# Patient Record
Sex: Female | Born: 1975 | Race: Black or African American | Hispanic: No | Marital: Single | Smoking: Former smoker
Health system: Southern US, Community
[De-identification: ages and names within clinical notes are randomized; demographics above are authoritative.]

## PROBLEM LIST (undated history)

## (undated) DIAGNOSIS — G709 Myoneural disorder, unspecified: Secondary | ICD-10-CM

## (undated) DIAGNOSIS — S82899A Other fracture of unspecified lower leg, initial encounter for closed fracture: Secondary | ICD-10-CM

## (undated) DIAGNOSIS — F329 Major depressive disorder, single episode, unspecified: Secondary | ICD-10-CM

## (undated) DIAGNOSIS — F1010 Alcohol abuse, uncomplicated: Secondary | ICD-10-CM

## (undated) DIAGNOSIS — R7303 Prediabetes: Secondary | ICD-10-CM

## (undated) DIAGNOSIS — D259 Leiomyoma of uterus, unspecified: Secondary | ICD-10-CM

## (undated) DIAGNOSIS — F411 Generalized anxiety disorder: Secondary | ICD-10-CM

## (undated) DIAGNOSIS — R200 Anesthesia of skin: Secondary | ICD-10-CM

## (undated) DIAGNOSIS — F32A Depression, unspecified: Secondary | ICD-10-CM

## (undated) DIAGNOSIS — F419 Anxiety disorder, unspecified: Secondary | ICD-10-CM

## (undated) DIAGNOSIS — E559 Vitamin D deficiency, unspecified: Secondary | ICD-10-CM

## (undated) DIAGNOSIS — E785 Hyperlipidemia, unspecified: Secondary | ICD-10-CM

## (undated) DIAGNOSIS — F1011 Alcohol abuse, in remission: Secondary | ICD-10-CM

## (undated) DIAGNOSIS — E538 Deficiency of other specified B group vitamins: Secondary | ICD-10-CM

## (undated) HISTORY — PX: FRACTURE SURGERY: SHX138

## (undated) HISTORY — PX: BREAST SURGERY: SHX581

## (undated) HISTORY — DX: Alcohol abuse, uncomplicated: F10.10

## (undated) HISTORY — PX: CHOLECYSTECTOMY: SHX55

## (undated) HISTORY — DX: Anxiety disorder, unspecified: F41.9

## (undated) HISTORY — DX: Depression, unspecified: F32.A

---
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
## 1998-06-17 ENCOUNTER — Emergency Department (HOSPITAL_COMMUNITY): Admission: EM | Admit: 1998-06-17 | Discharge: 1998-06-17 | Payer: Self-pay

## 2002-10-19 ENCOUNTER — Ambulatory Visit (HOSPITAL_BASED_OUTPATIENT_CLINIC_OR_DEPARTMENT_OTHER): Admission: RE | Admit: 2002-10-19 | Discharge: 2002-10-19 | Payer: Self-pay | Admitting: Obstetrics and Gynecology

## 2003-09-10 ENCOUNTER — Other Ambulatory Visit: Admission: RE | Admit: 2003-09-10 | Discharge: 2003-09-10 | Payer: Self-pay | Admitting: Obstetrics and Gynecology

## 2003-10-19 ENCOUNTER — Ambulatory Visit (HOSPITAL_COMMUNITY): Admission: RE | Admit: 2003-10-19 | Discharge: 2003-10-19 | Payer: Self-pay | Admitting: Gastroenterology

## 2003-10-21 ENCOUNTER — Ambulatory Visit (HOSPITAL_COMMUNITY): Admission: RE | Admit: 2003-10-21 | Discharge: 2003-10-21 | Payer: Self-pay | Admitting: Gastroenterology

## 2004-07-11 ENCOUNTER — Ambulatory Visit (HOSPITAL_COMMUNITY): Admission: RE | Admit: 2004-07-11 | Discharge: 2004-07-11 | Payer: Self-pay

## 2004-07-30 HISTORY — PX: LAPAROSCOPIC CHOLECYSTECTOMY: SUR755

## 2012-05-06 ENCOUNTER — Ambulatory Visit (INDEPENDENT_AMBULATORY_CARE_PROVIDER_SITE_OTHER): Payer: Medicare HMO | Admitting: Licensed Clinical Social Worker

## 2012-05-06 DIAGNOSIS — F331 Major depressive disorder, recurrent, moderate: Secondary | ICD-10-CM

## 2012-05-19 ENCOUNTER — Ambulatory Visit: Payer: Medicare HMO | Admitting: Licensed Clinical Social Worker

## 2013-03-17 ENCOUNTER — Ambulatory Visit: Payer: Medicare HMO | Admitting: Dietician

## 2013-12-13 ENCOUNTER — Emergency Department (HOSPITAL_COMMUNITY): Payer: Managed Care, Other (non HMO)

## 2013-12-13 ENCOUNTER — Encounter (HOSPITAL_COMMUNITY): Payer: Self-pay | Admitting: Emergency Medicine

## 2013-12-13 ENCOUNTER — Emergency Department (HOSPITAL_COMMUNITY)
Admission: EM | Admit: 2013-12-13 | Discharge: 2013-12-13 | Disposition: A | Discharge status: Home / Self Care | Payer: Managed Care, Other (non HMO) | Attending: Emergency Medicine | Admitting: Emergency Medicine

## 2013-12-13 DIAGNOSIS — S9306XA Dislocation of unspecified ankle joint, initial encounter: Secondary | ICD-10-CM | POA: Insufficient documentation

## 2013-12-13 DIAGNOSIS — E663 Overweight: Secondary | ICD-10-CM | POA: Insufficient documentation

## 2013-12-13 DIAGNOSIS — X58XXXA Exposure to other specified factors, initial encounter: Secondary | ICD-10-CM | POA: Insufficient documentation

## 2013-12-13 DIAGNOSIS — Z87891 Personal history of nicotine dependence: Secondary | ICD-10-CM | POA: Insufficient documentation

## 2013-12-13 DIAGNOSIS — Y92009 Unspecified place in unspecified non-institutional (private) residence as the place of occurrence of the external cause: Secondary | ICD-10-CM | POA: Insufficient documentation

## 2013-12-13 DIAGNOSIS — Y9301 Activity, walking, marching and hiking: Secondary | ICD-10-CM | POA: Insufficient documentation

## 2013-12-13 DIAGNOSIS — S82891A Other fracture of right lower leg, initial encounter for closed fracture: Secondary | ICD-10-CM

## 2013-12-13 LAB — CBC WITH DIFFERENTIAL/PLATELET
Basophils Absolute: 0 10*3/uL (ref 0.0–0.1)
Basophils Relative: 0 % (ref 0–1)
Eosinophils Absolute: 0.1 10*3/uL (ref 0.0–0.7)
Eosinophils Relative: 1 % (ref 0–5)
HCT: 37.5 % (ref 36.0–46.0)
Hemoglobin: 12 g/dL (ref 12.0–15.0)
Lymphocytes Relative: 34 % (ref 12–46)
Lymphs Abs: 3 10*3/uL (ref 0.7–4.0)
MCH: 23.5 pg — ABNORMAL LOW (ref 26.0–34.0)
MCHC: 32 g/dL (ref 30.0–36.0)
MCV: 73.4 fL — ABNORMAL LOW (ref 78.0–100.0)
Monocytes Absolute: 0.4 10*3/uL (ref 0.1–1.0)
Monocytes Relative: 5 % (ref 3–12)
Neutro Abs: 5.3 10*3/uL (ref 1.7–7.7)
Neutrophils Relative %: 60 % (ref 43–77)
Platelets: 336 10*3/uL (ref 150–400)
RBC: 5.11 MIL/uL (ref 3.87–5.11)
RDW: 14.2 % (ref 11.5–15.5)
WBC: 8.8 10*3/uL (ref 4.0–10.5)

## 2013-12-13 LAB — BASIC METABOLIC PANEL
BUN: 13 mg/dL (ref 6–23)
CO2: 22 mEq/L (ref 19–32)
Calcium: 9.2 mg/dL (ref 8.4–10.5)
Chloride: 102 mEq/L (ref 96–112)
Creatinine, Ser: 0.56 mg/dL (ref 0.50–1.10)
GFR calc Af Amer: >90 mL/min (ref >90)
GFR calc non Af Amer: >90 mL/min (ref >90)
Glucose, Bld: 114 mg/dL — ABNORMAL HIGH (ref 70–99)
Potassium: 4.5 mEq/L (ref 3.7–5.3)
Sodium: 140 mEq/L (ref 137–147)

## 2013-12-13 MED ORDER — PROPOFOL 10 MG/ML IV BOLUS
100.0000 mg | Freq: Once | INTRAVENOUS | Status: DC
  Filled 2013-12-13 (×2): qty 1

## 2013-12-13 MED ORDER — OXYCODONE-ACETAMINOPHEN 5-325 MG PO TABS: 1.0000 | ORAL_TABLET | Freq: Four times a day (QID) | ORAL | Status: DC | PRN

## 2013-12-13 MED ORDER — MORPHINE SULFATE 4 MG/ML IJ SOLN
4.0000 mg | Freq: Once | INTRAMUSCULAR | Status: AC
Start: 2013-12-13 — End: 2013-12-13
  Administered 2013-12-13: 4 mg via INTRAVENOUS
  Filled 2013-12-13: qty 1

## 2013-12-13 MED ORDER — PROPOFOL 10 MG/ML IV BOLUS
INTRAVENOUS | Status: AC | PRN
  Administered 2013-12-13: 20 mg via INTRAVENOUS
  Administered 2013-12-13: 50 mg via INTRAVENOUS

## 2013-12-13 MED ORDER — OXYCODONE-ACETAMINOPHEN 5-325 MG PO TABS
1.0000 | ORAL_TABLET | Freq: Four times a day (QID) | ORAL | Status: DC | PRN
  Administered 2013-12-13: 1 via ORAL
  Filled 2013-12-13: qty 1

## 2013-12-13 MED ORDER — ONDANSETRON HCL 4 MG/2ML IJ SOLN
4.0000 mg | Freq: Once | INTRAMUSCULAR | Status: AC
  Administered 2013-12-13: 4 mg via INTRAVENOUS
  Filled 2013-12-13: qty 2

## 2013-12-13 MED ORDER — SODIUM CHLORIDE 0.9 % IV SOLN
INTRAVENOUS | Status: AC | PRN
  Administered 2013-12-13: 1000 mL via INTRAVENOUS

## 2013-12-13 NOTE — ED Notes (Signed)
Ortho tech at bedside doing crutches education.

## 2013-12-13 NOTE — ED Notes (Signed)
Bed: IT19 Expected date:  Expected time:  Means of arrival:  Comments: EMS 89F Fall, ankle injury

## 2013-12-13 NOTE — Discharge Instructions (Signed)
Ankle Fracture A fracture is a break in the bone. A cast or splint is used to protect and keep your injured bone from moving.  HOME CARE INSTRUCTIONS   Use your crutches as directed.  To lessen the swelling, keep the injured leg elevated while sitting or lying down.  Apply ice to the injury for 15-20 minutes, 03-04 times per day while awake for 2 days. Put the ice in a plastic bag and place a thin towel between the bag of ice and your cast.  If you have a plaster or fiberglass cast:  Do not try to scratch the skin under the cast using sharp or pointed objects.  Check the skin around the cast every day. You may put lotion on any red or sore areas.  Keep your cast dry and clean.  If you have a plaster splint:  Wear the splint as directed.  You may loosen the elastic around the splint if your toes become numb, tingle, or turn cold or blue.  Do not put pressure on any part of your cast or splint; it may break. Rest your cast only on a pillow the first 24 hours until it is fully hardened.  Your cast or splint can be protected during bathing with a plastic bag. Do not lower the cast or splint into water.  Take medications as directed by your caregiver. Only take over-the-counter or prescription medicines for pain, discomfort, or fever as directed by your caregiver.  Do not drive a vehicle until your caregiver specifically tells you it is safe to do so.  If your caregiver has given you a follow-up appointment, it is very important to keep that appointment. Not keeping the appointment could result in a chronic or permanent injury, pain, and disability. If there is any problem keeping the appointment, you must call back to this facility for assistance. SEEK IMMEDIATE MEDICAL CARE IF:   Your cast gets damaged or breaks.  You have continued severe pain or more swelling than you did before the cast was put on.  Your skin or toenails below the injury turn blue or gray, or feel cold or  numb.  There is a bad smell or new stains and/or purulent (pus like) drainage coming from under the cast. If you do not have a window in your cast for observing the wound, a discharge or minor bleeding may show up as a stain on the outside of your cast. Report these findings to your caregiver. MAKE SURE YOU:   Understand these instructions.  Will watch your condition.  Will get help right away if you are not doing well or get worse. Document Released: 07/13/2000 Document Revised: 10/08/2011 Document Reviewed: 02/12/2013 [facility] Patient Information 2014 [place], [place].  Ankle Dislocation Ankle dislocation is the displacement of the bones that form your ankle joint. The ankle joint is designed for a balance of stability and flexibility. The bones of the ankle are held in place by very strong, fibrous tissues (ligaments) that connect the bones to each other. CAUSES Because the ankle is a very strong and stable joint, ankle dislocation is only caused by a very forceful injury. Typically, injuries that contribute to ankle dislocation include broken bones (fractures) on the inside and outside of the ankle (malleoli).  RELATED COMPLICATIONS Ankle dislocation can lead to more serious complications. Examples of complications associated with ankle dislocation include:  Injury to the strong fibrous tissues that connect muscles to bones (tendons).  Injury to the flexible tissue that cushions the  bones in the joint (cartilage). This can lead to the development of arthritis, loss of joint motion, and pain.  Injury to the nerves and blood vessels that cross the ankle. Blood vessel damage may result in bone death of the top bone of the foot (talus).  Skin over the dislocated area being torn (lacerated) or damaged by pressure from the dislocated bones.  Swelling of compartments in the foot (rare). This may damage blood supply to the muscles (compartment syndrome). RISK FACTORS Although dislocation of  the ankle can occur in anyone, some people are at greater risk than others. People at increased risk of ankle dislocation include:  Young males. This may be related to their overall increased risk of injury.  Postmenopausal women. This may be related to their increased risk of bone fracture because of the weakening of the bones that occurs in women in this age group (osteoporosis).  People born with greater looseness (elasticity) in their ligaments. SYMPTOMS Symptoms of ankle dislocation include:  Severe pain.  Swelling.  Deformity around the ankle.  Whitening or laceration of the skin. DIAGNOSIS  A physical exam and an X-ray exam are usually done to help your caregiver diagnose ankle dislocation. TREATMENT Treatment may include:  Manipulation of the ankle by your caregiver to put your ankle back in place (reduction).  Repair of any associated skin lacerations.  Plates and screws used to stabilize the fractures and hold the joint in position after reduction.  Pins drilled into your bones that are connected to bars outside of your skin (external fixator) used to hold your ankle in a fixed position until the swelling in your ankle goes down enough for surgery to be done.  Placement of a cast or splint to allow torn ligaments to heal.  Physical therapy to regain ankle motion and leg strength. HOME CARE INSTRUCTIONS The following measures can help to reduce pain and hasten the healing process:  Rest your injured joint. Do not move it. Avoid activities similar to the one that caused your injury.  Apply ice to your injured joint for 1 to 2 days after your reduction or as directed by your caregiver. Applying ice helps to reduce inflammation and pain.  Put ice in a plastic bag.  Place a towel between your skin and the bag.  Leave the ice on for 15 to 20 minutes at a time, every couple of hours while you are awake.  Elevate your ankle above your heart to minimize  swelling.  Move your toes as instructed by your caregiver to prevent stiffness.  Take over-the-counter or prescription medicines for pain as directed by your caregiver. SEEK IMMEDIATE MEDICAL CARE IF:  Your cast, splint, screws, plates, or external fixator becomes loose or damaged.  You have an external fixator and you notice fluids draining around the pins.  Your pain becomes worse rather than better.  You lose feeling in your toe or cannot bend the tip of your toe. MAKE SURE YOU:  Understand these instructions.  Will watch your condition.  Will get help right away if you are not doing well or get worse. Document Released: 07/16/2005 Document Revised: 10/08/2011 Document Reviewed: 12/14/2010 [facility] Patient Information 2014 [place], [place].

## 2013-12-13 NOTE — ED Provider Notes (Signed)
CSN: [id number]     Arrival date & time 12/13/13  0440 History   First MD Initiated Contact with Patient 12/13/13 0448     Chief Complaint  Patient presents with  . Ankle Injury     (Consider location/radiation/quality/duration/timing/severity/associated sxs/prior Treatment) HPI  This is a 38 year old female with no significant past medical history who presents with a right ankle injury. Patient reports that she misstepped and injured her right ankle.  Patient was given 200 mcg of fentanyl were given in route. Patient reports her pain is 10. She denies any numbness or tingling of the right foot. She denies hitting her head or loss of consciousness. She's not on any anticoagulants.  History reviewed. No pertinent past medical history. Past Surgical History  Procedure Laterality Date  . Cholecystectomy     No family history on file. History  Substance Use Topics  . Smoking status: Former Research scientist (life sciences)  . Smokeless tobacco: Not on file  . Alcohol Use: Yes   OB History   Grav Para Term Preterm Abortions TAB SAB Ect Mult Living                 Review of Systems  Musculoskeletal:       Right ankle pain  Neurological: Negative for weakness, numbness and headaches.  All other systems reviewed and are negative.     Allergies  Review of patient's allergies indicates no known allergies.  Home Medications   Prior to Admission medications   Medication Sig Start Date End Date Taking? Authorizing Provider  ibuprofen (ADVIL,MOTRIN) 200 MG tablet Take 400 mg by mouth every 6 (six) hours as needed (for pain/fever).   Yes Historical Provider, MD   BP 107/49  Pulse 100  Temp(Src) 98.2 F (36.8 C) (Oral)  Resp 15  Ht 5\' 1"  (1.549 m)  Wt 240 lb (108.863 kg)  BMI 45.37 kg/m2  SpO2 99%  LMP 11/19/2013 Physical Exam  Nursing note and vitals reviewed. Constitutional: She is oriented to person, place, and time. She appears well-developed and well-nourished.  Overweight  HENT:  Head:  Normocephalic and atraumatic.  Braces  Eyes: Pupils are equal, round, and reactive to light.  Cardiovascular: Normal rate, regular rhythm and normal heart sounds.   No murmur heard. Pulmonary/Chest: Effort normal and breath sounds normal. No respiratory distress. She has no wheezes.  Abdominal: Soft. There is no tenderness.  Musculoskeletal:  Obvious deformity of the right ankle with mild tenting of the skin anteriorly, 2+ DP pulse, skin intact, neurovascularly intact distally  Neurological: She is alert and oriented to person, place, and time.  Skin: Skin is warm and dry.  Psychiatric: She has a normal mood and affect.    ED Course  Reduction of dislocation Date/Time: 12/13/2013 8:31 AM Performed by: Thayer Jew, F Authorized by: Thayer Jew, F Consent: Verbal consent obtained. written consent obtained. Risks and benefits: risks, benefits and alternatives were discussed Consent given by: patient Patient sedated: yes Sedation type: moderate (conscious) sedation Sedatives: propofol Patient tolerance: Patient tolerated the procedure well with no immediate complications. Comments: Reduction of right ankle fracture   (including critical care time)  Procedural sedation Performed by: Merryl Hacker Consent: Verbal consent obtained. Risks and benefits: risks, benefits and alternatives were discussed Required items: required blood products, implants, devices, and special equipment available Patient identity confirmed: arm band and provided demographic data Time out: Immediately prior to procedure a "time out" was called to verify the correct patient, procedure, equipment, support staff and site/side marked  as required.  Sedation type: moderate (conscious) sedation NPO time confirmed and considedered  Sedatives: PROPOFOL  Physician Time at Bedside: 20 min  Vitals: Vital signs were monitored during sedation. Cardiac Monitor, pulse oximeter Patient tolerance: Patient  tolerated the procedure well with no immediate complications. Comments: Pt with uneventful recovered. Returned to pre-procedural sedation baseline     Labs Review Labs Reviewed  CBC WITH DIFFERENTIAL - Abnormal; Notable for the following:    MCV 73.4 (*)    MCH 23.5 (*)    All other components within normal limits  BASIC METABOLIC PANEL - Abnormal; Notable for the following:    Glucose, Bld 114 (*)    All other components within normal limits    Imaging Review Dg Ankle Complete Right  12/13/2013   CLINICAL DATA:  Post reduction, right ankle fracture-dislocation  EXAM: RIGHT ANKLE - COMPLETE 3+ VIEW  COMPARISON:  12/13/2013  FINDINGS: Cast artifact obscures detail. There is widening of the mortise medially with apparent relocation of the tibia with respect to the talar dome. Oblique distal fibular diaphyseal fracture reidentified with fracture fragments in near anatomic alignment. No radiopaque foreign body.  IMPRESSION: Postreduction right ankle fracture-dislocation as above.   Electronically Signed   By: Conchita Paris M.D.   On: 12/13/2013 07:51   Dg Ankle Complete Right  12/13/2013   CLINICAL DATA:  Right ankle pain and deformity following a fall.  EXAM: RIGHT ANKLE - COMPLETE 3+ VIEW  COMPARISON:  None.  FINDINGS: Anterior and medial dislocation of the distal tibia relative to the talus. Two small bone fragments between the two. There is also a fracture of the distal shaft of the fibula with 2/3 shaft width of lateral displacement and mild lateral angulation of the distal fragment. There is also pronounced posterior angulation of the distal fragment. Also noted is a small avulsion fracture off the distal aspect of the lateral malleolus.  IMPRESSION: Dislocation and fractures, as described above.   Electronically Signed   By: Enrique Sack M.D.   On: 12/13/2013 05:38     EKG Interpretation None      MDM   Final diagnoses:  Ankle dislocation  R closed ankle fracture  Patient  presents with injury to the right ankle. No other obvious injury on exam. She is neurovascularly intact. X-ray shows fracture dislocation. Patient was consented for sedation and dislocation was reduced at the bedside without complication. Discuss with Dr. Tonita Cong  he has reviewed post reduction films and will followup with the patient tomorrow. Patient will be discharged with pain medication and crutches. Patient was given strict return precautions.  After history, exam, and medical workup I feel the patient has been appropriately medically screened and is safe for discharge home. Pertinent diagnoses were discussed with the patient. Patient was given return precautions.      Merryl Hacker, MD 12/13/13 2248

## 2013-12-13 NOTE — ED Notes (Signed)
Per EMS pt was at a friend's house when she missed a step injuring her right ankle.  Per EMS there is obvious deformity. Pt was given 200 mcg of fentanyl en route.  Pt became drowsy O2 @ 2L placed on pt to maintain O2 sats.

## 2013-12-13 NOTE — ED Notes (Signed)
Patient transported to X-ray 

## 2013-12-14 ENCOUNTER — Other Ambulatory Visit: Payer: Self-pay | Admitting: Orthopedic Surgery

## 2013-12-14 ENCOUNTER — Emergency Department (HOSPITAL_COMMUNITY)
Admission: EM | Admit: 2013-12-14 | Discharge: 2013-12-14 | Disposition: A | Discharge status: Home / Self Care | Payer: Managed Care, Other (non HMO) | Attending: Emergency Medicine | Admitting: Emergency Medicine

## 2013-12-14 ENCOUNTER — Encounter (HOSPITAL_COMMUNITY): Payer: Self-pay | Admitting: Emergency Medicine

## 2013-12-14 ENCOUNTER — Encounter (HOSPITAL_COMMUNITY): Payer: Self-pay | Admitting: Pharmacy Technician

## 2013-12-14 DIAGNOSIS — Z87891 Personal history of nicotine dependence: Secondary | ICD-10-CM | POA: Insufficient documentation

## 2013-12-14 DIAGNOSIS — G8911 Acute pain due to trauma: Secondary | ICD-10-CM | POA: Insufficient documentation

## 2013-12-14 DIAGNOSIS — M25579 Pain in unspecified ankle and joints of unspecified foot: Secondary | ICD-10-CM | POA: Insufficient documentation

## 2013-12-14 DIAGNOSIS — R52 Pain, unspecified: Secondary | ICD-10-CM

## 2013-12-14 MED ORDER — HYDROMORPHONE HCL PF 1 MG/ML IJ SOLN
1.0000 mg | Freq: Once | INTRAMUSCULAR | Status: AC
  Administered 2013-12-14: 1 mg via INTRAMUSCULAR
  Filled 2013-12-14: qty 1

## 2013-12-14 MED ORDER — ONDANSETRON HCL 4 MG PO TABS
4.0000 mg | ORAL_TABLET | Freq: Once | ORAL | Status: AC
  Administered 2013-12-14: 4 mg via ORAL
  Filled 2013-12-14: qty 1

## 2013-12-14 MED ORDER — HYDROMORPHONE HCL PF 1 MG/ML IJ SOLN: 1.0000 mg | Freq: Once | INTRAMUSCULAR | Status: DC

## 2013-12-14 MED ORDER — OXYCODONE-ACETAMINOPHEN 5-325 MG PO TABS
2.0000 | ORAL_TABLET | Freq: Once | ORAL | Status: AC
  Administered 2013-12-14: 2 via ORAL
  Filled 2013-12-14: qty 2

## 2013-12-14 MED ORDER — ONDANSETRON HCL 4 MG/2ML IJ SOLN: 4.0000 mg | Freq: Once | INTRAMUSCULAR | Status: DC

## 2013-12-14 NOTE — ED Notes (Addendum)
Pt. reports persistent right ankle pain / toes numb unrelieved by pain medications prescribed here this evening , diagnosed with right ankle dislocation/fracture.

## 2013-12-14 NOTE — Discharge Instructions (Signed)
Take Percocet 2 tabs every 4 hours as needed for pain.  Keep follow up as directed with orthopedic surgeon today in the office.  Keep your foot elevated above the level of your heart. Apply ice. Use crutches do not bear weight on your right leg.

## 2013-12-14 NOTE — ED Notes (Signed)
Applied 2 ice bags to either side of right ankle and raised the leg with two pillows, both for swelling and pain control.

## 2013-12-14 NOTE — ED Provider Notes (Signed)
CSN: [id number]     Arrival date & time 12/14/13  0435 History   First MD Initiated Contact with Patient 12/14/13 0532     Chief Complaint  Patient presents with  . Ankle Pain     (Consider location/radiation/quality/duration/timing/severity/associated sxs/prior Treatment) HPI History provided by patient. Was evaluated here last night for right ankle fracture dislocation. She had sedation and reduction. She was discharged home with prescription for Percocet which she has been taking as prescribed one pill every 6 hours. She continues to have severe pain and presents with uncontrolled symptoms at home. No new trauma. Pain sharp in quality and not radiating from right ankle. No known alleviating factors. She is planning to see orthopedic surgeon today in clinic.  History reviewed. No pertinent past medical history. Past Surgical History  Procedure Laterality Date  . Cholecystectomy     No family history on file. History  Substance Use Topics  . Smoking status: Former Research scientist (life sciences)  . Smokeless tobacco: Not on file  . Alcohol Use: Yes   OB History   Grav Para Term Preterm Abortions TAB SAB Ect Mult Living                 Review of Systems  Respiratory: Negative for shortness of breath.   Cardiovascular: Negative for chest pain.  Gastrointestinal: Negative for vomiting.  Musculoskeletal: Negative for back pain and neck pain.  Skin: Positive for wound.  Neurological: Negative for weakness and numbness.  All other systems reviewed and are negative.     Allergies  Review of patient's allergies indicates no known allergies.  Home Medications   Prior to Admission medications   Medication Sig Start Date End Date Taking? Authorizing Provider  ibuprofen (ADVIL,MOTRIN) 200 MG tablet Take 400 mg by mouth every 6 (six) hours as needed (for pain/fever).   Yes Historical Provider, MD  oxyCODONE-acetaminophen (PERCOCET/ROXICET) 5-325 MG per tablet Take 1 tablet by mouth every 6 (six) hours  as needed for severe pain. 12/13/13  Yes Merryl Hacker, MD   BP 112/58  Pulse 92  Temp(Src) 98.3 F (36.8 C) (Oral)  Resp 16  Ht 5\' 1"  (1.549 m)  Wt 225 lb (102.059 kg)  BMI 42.54 kg/m2  SpO2 98%  LMP 11/19/2013 Physical Exam  Constitutional: She is oriented to person, place, and time. She appears well-developed and well-nourished.  HENT:  Head: Normocephalic and atraumatic.  Eyes: EOM are normal. Pupils are equal, round, and reactive to light.  Neck: Neck supple.  Cardiovascular: Normal rate, regular rhythm and intact distal pulses.   Pulmonary/Chest: Effort normal and breath sounds normal. No respiratory distress.  Musculoskeletal:  Right lower extremity with splint in place, distal cap refill intact, able to move her toes with distal sensorium to light touch intact  Neurological: She is alert and oriented to person, place, and time.  Skin: Skin is warm and dry.    ED Course  Procedures (including critical care time) Labs Review Labs Reviewed - No data to display  Imaging Review Dg Ankle Complete Right  12/13/2013   CLINICAL DATA:  Post reduction, right ankle fracture-dislocation  EXAM: RIGHT ANKLE - COMPLETE 3+ VIEW  COMPARISON:  12/13/2013  FINDINGS: Cast artifact obscures detail. There is widening of the mortise medially with apparent relocation of the tibia with respect to the talar dome. Oblique distal fibular diaphyseal fracture reidentified with fracture fragments in near anatomic alignment. No radiopaque foreign body.  IMPRESSION: Postreduction right ankle fracture-dislocation as above.   Electronically Signed  By: Conchita Paris M.D.   On: 12/13/2013 07:51   Dg Ankle Complete Right  12/13/2013   CLINICAL DATA:  Right ankle pain and deformity following a fall.  EXAM: RIGHT ANKLE - COMPLETE 3+ VIEW  COMPARISON:  None.  FINDINGS: Anterior and medial dislocation of the distal tibia relative to the talus. Two small bone fragments between the two. There is also a  fracture of the distal shaft of the fibula with 2/3 shaft width of lateral displacement and mild lateral angulation of the distal fragment. There is also pronounced posterior angulation of the distal fragment. Also noted is a small avulsion fracture off the distal aspect of the lateral malleolus.  IMPRESSION: Dislocation and fractures, as described above.   Electronically Signed   By: Enrique Sack M.D.   On: 12/13/2013 05:38   IM Dilaudid and zofran provided RLE elevated and ice applied  6:53 AM feeling much better after medication. 2 Percocet provided. Patient comfortable for discharge home  Plan discharge home and followup with orthopedics as instructed. EMR records reviewed x-rays as above. Patient to followup with Dr. Tonita Cong. She'll take Percocet 2 tabs every 4 hours as needed.  MDM   Diagnosis: Uncontrolled pain after her right ankle fracture dislocation  Patient taking Percocet once every 6 hours as directed/ not achieving adequate pain control.   Teressa Lower, MD 12/14/13 [phone]

## 2013-12-14 NOTE — Progress Notes (Signed)
Sandy BISSELL, PA  - Please enter preop orders in epic for Dr. Reather Littler pt  - [name].  She is an add on for surgery Thursday  5/21 and she is coming to [facility] tomorrow  5/19 for her preop.  Thanks.

## 2013-12-15 ENCOUNTER — Encounter (HOSPITAL_COMMUNITY): Payer: Self-pay

## 2013-12-15 ENCOUNTER — Encounter (HOSPITAL_COMMUNITY)
Admission: RE | Admit: 2013-12-15 | Discharge: 2013-12-15 | Disposition: A | Discharge status: Home / Self Care | Payer: Managed Care, Other (non HMO) | Source: Ambulatory Visit | Attending: Specialist | Admitting: Specialist

## 2013-12-15 HISTORY — DX: Other fracture of unspecified lower leg, initial encounter for closed fracture: S82.899A

## 2013-12-15 HISTORY — DX: Anesthesia of skin: R20.0

## 2013-12-15 LAB — PREGNANCY, URINE: PREG TEST UR: NEGATIVE

## 2013-12-15 NOTE — Patient Instructions (Addendum)
Sandy [name]  12/15/2013                           YOUR PROCEDURE IS SCHEDULED ON: 12/17/13 at 11:30 AM               [place] TO SHORT STAY CENTER                 ARRIVE AT SHORT STAY AT: 9:30 AM               CALL THIS NUMBER IF ANY PROBLEMS THE DAY OF SURGERY :               [phone]                                REMEMBER:   Do not eat food or drink liquids AFTER MIDNIGHT                 Take these medicines the morning of surgery with               A SIPS OF WATER :   PERCOCET IF NEEDED FOR PAIN     Do not wear jewelry, make-up   Do not wear lotions, powders, or perfumes.   Do not shave legs or underarms 12 hrs. before surgery (men may shave face)  Do not bring valuables to the hospital.  Contacts, dentures or bridgework may not be worn into surgery.  Leave suitcase in the car. After surgery it may be brought to your room.  For patients admitted to the hospital more than one night, checkout time is            11:00 AM                                                       The day of discharge.   Patients discharged the day of surgery will not be allowed to drive home.            If going home same day of surgery, must have someone stay with you              FIRST 24 hrs at home and arrange for some one to drive you              home from hospital.   ________________________________________________________________________  [place]  Before surgery, you can play an important role.  Because skin is not sterile, your skin needs to be as free of germs as possible.  You can reduce the number of germs on your skin by washing with CHG (chlorahexidine gluconate) soap before surgery.  CHG is an antiseptic cleaner which kills germs and  bonds with the skin to continue killing germs even after washing. Please DO NOT use if you have an allergy to CHG or antibacterial soaps.  If your skin becomes reddened/irritated stop using the CHG and inform your nurse when you arrive at Short Stay. Do not shave (including legs and underarms) for at least 48 hours prior to the first CHG shower.  You may shave your face. Please follow these instructions carefully:  1.  Shower with CHG Soap the night before surgery and the  morning of Surgery.   2.  If you choose to wash your hair, wash your hair first as usual with your  normal  Shampoo.   3.  After you shampoo, rinse your hair and body thoroughly to remove the  shampoo.                                         4.  Use CHG as you would any other liquid soap.  You can apply chg directly  to the skin and wash . Gently wash with scrungie or clean wascloth    5.  Apply the CHG Soap to your body ONLY FROM THE NECK DOWN.   Do not use on open                           Wound or open sores. Avoid contact with eyes, ears mouth and genitals (private parts).                        Genitals (private parts) with your normal soap.              6.  Wash thoroughly, paying special attention to the area where your surgery  will be performed.   7.  Thoroughly rinse your body with warm water from the neck down.   8.  DO NOT shower/wash with your normal soap after using and rinsing off  the CHG Soap .                9.  Pat yourself dry with a clean towel.             10.  Wear clean pajamas.             11.  Place clean sheets on your bed the night of your first shower and do not  sleep with pets.  Day of Surgery : Do not apply any lotions/deodorants the morning of surgery.  Please wear clean clothes to the hospital/surgery center.  FAILURE TO FOLLOW THESE INSTRUCTIONS MAY RESULT IN THE CANCELLATION OF YOUR SURGERY    PATIENT SIGNATURE_________________________________     Incentive  Spirometer  An incentive spirometer is a tool that can help keep your lungs clear and active. This tool measures how well you are filling your lungs with each breath. Taking long deep breaths may help reverse or decrease the chance of developing breathing (pulmonary) problems (especially infection) following:  A long  period of time when you are unable to move or be active. BEFORE THE PROCEDURE   If the spirometer includes an indicator to show your best effort, your nurse or respiratory therapist will set it to a desired goal.  If possible, sit up straight or lean slightly forward. Try not to slouch.  Hold the incentive spirometer in an upright position. INSTRUCTIONS FOR USE  1. Sit on the edge of your bed if possible, or sit up as far as you can in bed or on a chair. 2. Hold the incentive spirometer in an upright position. 3. Breathe out normally. 4. Place the mouthpiece in your mouth and seal your lips tightly around it. 5. Breathe in slowly and as deeply as possible, raising the piston or the ball toward the top of the column. 6. Hold your breath for 3-5 seconds or for as long as possible. Allow the piston or ball to fall to the bottom of the column. 7. Remove the mouthpiece from your mouth and breathe out normally. 8. Rest for a few seconds and repeat Steps 1 through 7 at least 10 times every 1-2 hours when you are awake. Take your time and take a few normal breaths between deep breaths. 9. The spirometer may include an indicator to show your best effort. Use the indicator as a goal to work toward during each repetition. 10. After each set of 10 deep breaths, practice coughing to be sure your lungs are clear. If you have an incision (the cut made at the time of surgery), support your incision when coughing by placing a pillow or rolled up towels firmly against it. Once you are able to get out of bed, walk around indoors and cough well. You may stop using the incentive spirometer when  instructed by your caregiver.  RISKS AND COMPLICATIONS  Take your time so you do not get dizzy or light-headed.  If you are in pain, you may need to take or ask for pain medication before doing incentive spirometry. It is harder to take a deep breath if you are having pain. AFTER USE  Rest and breathe slowly and easily.  It can be helpful to keep track of a log of your progress. Your caregiver can provide you with a simple table to help with this. If you are using the spirometer at home, follow these instructions: [place] IF:   You are having difficultly using the spirometer.  You have trouble using the spirometer as often as instructed.  Your pain medication is not giving enough relief while using the spirometer.  You develop fever of 100.5 F (38.1 C) or higher. SEEK IMMEDIATE MEDICAL CARE IF:   You cough up bloody sputum that had not been present before.  You develop fever of 102 F (38.9 C) or greater.  You develop worsening pain at or near the incision site. MAKE SURE YOU:   Understand these instructions.  Will watch your condition.  Will get help right away if you are not doing well or get worse. Document Released: 11/26/2006 Document Revised: 10/08/2011 Document Reviewed: 01/27/2007 [facility] Patient Information 2014 [place], [place].   ________________________________________________________________________

## 2013-12-16 ENCOUNTER — Other Ambulatory Visit: Payer: Self-pay | Admitting: Orthopedic Surgery

## 2013-12-16 NOTE — H&P (Signed)
Sandy [name] DOB: [birth year]  Chief Complaint: R ankle pain  History of Present Illness The patient is a 38 year old female who presents with ankle complaints. The patient reports right ankle symptoms which began 1 day(s) ago following a specific injury. The injury occurred 1 day(s) ago due to a fall (missed a step). Symptoms are reported to be located in the right ankle and include ankle pain and swelling. Prior to being seen today the patient was previously evaluated in the emergency room. Previous work-up for this problem has included ankle x-rays. Past treatment for this problem has included posterior splint (following reduction of right ankle fracture) and crutches (non-weight bearing). Note for "Ankle pain": Sandy [name] presents for eval of R ankle pain following a fall on 5/17 at a friend's house, down steps. She experienced sudden severe pain with a notable deformity of the ankle, presented to the ER where she was found to have a fx/dislocation, which was reduced and splinted. She was discharged but returned early AM 5/18 due to inadequate pain control and was given Rx for Percocet, which has been helping more for her pain. She is noting some cramping in her R lower leg. Denies numbness, tingling, other injuries from her fall. She had a fall down the same steps several years ago with a less severe injury that was never treated, but she did limp for a while after that. She does work from home.  Allergies No Known Drug Allergies. 12/14/2013  Social History Children. 0 Current drinker. 12/14/2013: Currently drinks wine less than 5 times per week Current work status. working full time Exercise. Exercises daily; does gym / weights Living situation. live with partner Marital status. single No history of drug/alcohol rehab Tobacco / smoke exposure. 12/14/2013: no Tobacco use. Former smoker. 12/14/2013: smoke(d) less than 1/2 pack(s) per day uses less than 1/2 can(s) smokeless  per week Under pain contract  Medication History Advil (200MG  Capsule, 1 (one) Oral) Active. Percocet (5-325MG  Tablet, Oral) Active.  Pregnancy / Birth History Pregnant. no  Past Surgical History Gallbladder Surgery. laporoscopic  Review of Systems General:Present- Weight Gain. Not Present- Chills, Fever, Night Sweats, Appetite Loss, Fatigue, Feeling sick and Weight Loss. Skin:Not Present- Itching, Rash, Skin Color Changes, Ulcer, Psoriasis and Change in Hair or Nails. HEENT:Not Present- Sensitivity to light, Hearing problems, Nose Bleed and Ringing in the Ears. Neck:Not Present- Swollen Glands and Neck Mass. Respiratory:Not Present- Snoring, Chronic Cough, Bloody sputum and Dyspnea. Cardiovascular:Not Present- Shortness of Breath, Chest Pain, Swelling of Extremities, Leg Cramps and Palpitations. Gastrointestinal:Not Present- Bloody Stool, Heartburn, Abdominal Pain, Vomiting, Nausea and Incontinence of Stool. Female Genitourinary:Not Present- Blood in Urine, Menstrual Irregularities, Frequency, Incontinence and Nocturia. Musculoskeletal:Present- Joint Stiffness, Joint Swelling, Joint Pain and Back Pain. Not Present- Muscle Weakness and Muscle Pain. Neurological:Not Present- Tingling, Numbness, Burning, Tremor, Headaches and Dizziness. Psychiatric:Not Present- Anxiety, Depression and Memory Loss. Endocrine:Not Present- Cold Intolerance, Heat Intolerance, Excessive hunger and Excessive Thirst. Hematology:Not Present- Abnormal Bleeding, Anemia, Blood Clots and Easy Bruising.  Vitals 12/14/2013 11:15 AM Weight: 240 lb Height: 61 in Body Surface Area: 2.16 m Body Mass Index: 45.35 kg/m BP: 129/83 (Sitting, Right Arm, Standard)  Physical Exam The physical exam findings are as follows:  General Mental Status - Alert. General Appearance- pleasant and In acute distress (mild). Orientation- Oriented X3. Build & Nutrition- Well nourished. Gait- Use of  assistive device (wheelchair, crutches). Mental Status- Alert.  Peripheral Vascular Lower Extremity: Palpation:Tenderness- Right- no calf tenderness to palpation. Posterior tibial pulse- Bilateral- 2+.  Dorsalis pedis pulse- Bilateral- 2+.  Neurologic Sensation:Lower Extremity- Right- sensation is intact in the lower extremity, unless otherwise mentioned.  Musculoskeletal Lower Extremity Right Lower Extremity: Right Ankle: Inspection and Palpation:Clinical Contours- Note: splint in place R ankle Tenderness- medial malleolus tender to palpation, distal fibula tender to palpation, deltoid ligament tender to palpation and syndesmosis tender to palpation. no tenderness to palpation of the Achilles tendon, no tenderness to palpation of the calcaneus, no tenderness to palpation of the base of the 5th metatarsal and no tenderness to palpation of the calf. Swelling- moderate. Tissue tension/texture is - soft. Sensation is - normal. Strength and Tone:Testing limited- Note: known fx QJJ:HERDEYC limited- Note: due to pain and known fx, not evaluated Right Foot: Inspection and Palpation:Tenderness- no tenderness to palpation. Swelling- none.  Imaging pre and post-reduction xrays R ankle [facility] system reviewed. Pre-reduction xrays with anteromedial ankle dislocation, displaced and angulated distal fibular shaft fx. Small chronic calcification medial malleolus, likely due to prior injury. Post-reduction xrays with successful reduction of ankle dislocation with improved alignment of distal fibular shaft fx, minimally displaced. There is widening of the mortise and syndesmosis noted. Splint in place.  Assessment & Plan R ankle fx/dislocation  Pt 1 day s/p R ankle fx/dislocation following fall, closed reduction and splinting performed in ER, pain controlled with Percocet. Discussed the nature of her injury, reviewed relevant anatomy. Given her dislocation and disruption of the  mortise and syndesmosis, recommend proceeding with ORIF R ankle with syndesmosis fixation. Discussed possible need for plating her fibular shaft fx as well depending on stability intra-operatively, she may require only syndesmosis fixation and possibly deltoid repair. Discussed the procedure itself as well as risks, complications, and alternatives including but not limited to DVT, PE, infx, bleeding, failure of procedure, need for secondary procedure, anesthesia risk, even death. Discussed post-op protocols, NWB status potentially up to 3 months to allow full healing, time out of work, DVT ppx, casting, pain medications. In the interim, recommend aggressive ice and elevation, toes above the nose, to reduce swelling and lessen the chance for her to develop fx blisters which could delay surgery. She will remain in her splint, continue Percocet for pain, refilled Rx, added Robaxin for spasms as well. Plan to proceed with surgery this week. All her questions were answered. Denies hx of DVT or MRSA. She is otherwise healthy. She will follow up 10-14 days post-op for staple removal and xrays and will call with any questions or concerns in the interim.  She apparently saw Dr. Gladstone Lighter on 5/20 due to pain and swelling, new splint was applied with relief.  Plan R ankle ORIF with syndesmosis fixation  Signed electronically by Lacie Draft PA-C for Dr. Tonita Cong

## 2013-12-17 ENCOUNTER — Ambulatory Visit (HOSPITAL_COMMUNITY): Payer: Managed Care, Other (non HMO)

## 2013-12-17 ENCOUNTER — Encounter (HOSPITAL_COMMUNITY): Payer: Self-pay | Admitting: *Deleted

## 2013-12-17 ENCOUNTER — Ambulatory Visit (HOSPITAL_COMMUNITY): Payer: Managed Care, Other (non HMO) | Admitting: Anesthesiology

## 2013-12-17 ENCOUNTER — Encounter (HOSPITAL_COMMUNITY): Payer: Managed Care, Other (non HMO) | Admitting: Anesthesiology

## 2013-12-17 ENCOUNTER — Observation Stay (HOSPITAL_COMMUNITY)
Admission: RE | Admit: 2013-12-17 | Discharge: 2013-12-18 | Disposition: A | Discharge status: Home / Self Care | Payer: Managed Care, Other (non HMO) | Source: Ambulatory Visit | Attending: Specialist | Admitting: Specialist

## 2013-12-17 ENCOUNTER — Encounter (HOSPITAL_COMMUNITY)
Admission: RE | Disposition: A | Discharge status: Home / Self Care | Payer: Self-pay | Source: Ambulatory Visit | Attending: Specialist

## 2013-12-17 DIAGNOSIS — W108XXA Fall (on) (from) other stairs and steps, initial encounter: Secondary | ICD-10-CM | POA: Insufficient documentation

## 2013-12-17 DIAGNOSIS — S82843A Displaced bimalleolar fracture of unspecified lower leg, initial encounter for closed fracture: Principal | ICD-10-CM | POA: Insufficient documentation

## 2013-12-17 DIAGNOSIS — Z87891 Personal history of nicotine dependence: Secondary | ICD-10-CM | POA: Insufficient documentation

## 2013-12-17 DIAGNOSIS — S93439A Sprain of tibiofibular ligament of unspecified ankle, initial encounter: Secondary | ICD-10-CM | POA: Insufficient documentation

## 2013-12-17 DIAGNOSIS — Z6841 Body Mass Index (BMI) 40.0 and over, adult: Secondary | ICD-10-CM | POA: Insufficient documentation

## 2013-12-17 DIAGNOSIS — S82891A Other fracture of right lower leg, initial encounter for closed fracture: Secondary | ICD-10-CM | POA: Diagnosis present

## 2013-12-17 HISTORY — PX: ORIF ANKLE FRACTURE: SHX5408

## 2013-12-17 LAB — CREATININE, SERUM
CREATININE: 0.72 mg/dL (ref 0.50–1.10)
GFR calc Af Amer: >90 mL/min (ref >90)

## 2013-12-17 LAB — CBC
HCT: 35.6 % — ABNORMAL LOW (ref 36.0–46.0)
Hemoglobin: 11.4 g/dL — ABNORMAL LOW (ref 12.0–15.0)
MCH: 23.5 pg — ABNORMAL LOW (ref 26.0–34.0)
MCHC: 32 g/dL (ref 30.0–36.0)
MCV: 73.4 fL — ABNORMAL LOW (ref 78.0–100.0)
PLATELETS: 363 10*3/uL (ref 150–400)
RBC: 4.85 MIL/uL (ref 3.87–5.11)
RDW: 14.2 % (ref 11.5–15.5)
WBC: 9.2 10*3/uL (ref 4.0–10.5)

## 2013-12-17 SURGERY — OPEN REDUCTION INTERNAL FIXATION (ORIF) ANKLE FRACTURE
Anesthesia: General | Site: Ankle | Laterality: Right

## 2013-12-17 MED ORDER — SODIUM CHLORIDE 0.45 % IV SOLN
INTRAVENOUS | Status: DC
  Administered 2013-12-17: 23:00:00 via INTRAVENOUS

## 2013-12-17 MED ORDER — HYDROMORPHONE HCL PF 1 MG/ML IJ SOLN
INTRAMUSCULAR | Status: DC | PRN
  Administered 2013-12-17 (×2): 1 mg via INTRAVENOUS

## 2013-12-17 MED ORDER — ONDANSETRON HCL 4 MG/2ML IJ SOLN
INTRAMUSCULAR | Status: AC
  Filled 2013-12-17: qty 2

## 2013-12-17 MED ORDER — HYDROMORPHONE HCL PF 1 MG/ML IJ SOLN
0.2500 mg | INTRAMUSCULAR | Status: DC | PRN
  Administered 2013-12-17: 0.5 mg via INTRAVENOUS
  Administered 2013-12-17: 0.25 mg via INTRAVENOUS

## 2013-12-17 MED ORDER — ACETAMINOPHEN 10 MG/ML IV SOLN
1000.0000 mg | Freq: Four times a day (QID) | INTRAVENOUS | Status: DC
  Administered 2013-12-17: 1000 mg via INTRAVENOUS
  Filled 2013-12-17 (×4): qty 100

## 2013-12-17 MED ORDER — LACTATED RINGERS IV SOLN
INTRAVENOUS | Status: DC
  Administered 2013-12-17: 1000 mL via INTRAVENOUS
  Administered 2013-12-17: 15:00:00 via INTRAVENOUS

## 2013-12-17 MED ORDER — BISACODYL 5 MG PO TBEC
5.0000 mg | DELAYED_RELEASE_TABLET | Freq: Every day | ORAL | Status: DC | PRN
  Filled 2013-12-17: qty 1

## 2013-12-17 MED ORDER — METOCLOPRAMIDE HCL 5 MG/ML IJ SOLN
5.0000 mg | Freq: Three times a day (TID) | INTRAMUSCULAR | Status: DC | PRN
  Administered 2013-12-17: 10 mg via INTRAVENOUS
  Filled 2013-12-17: qty 2

## 2013-12-17 MED ORDER — METHOCARBAMOL 1000 MG/10ML IJ SOLN
500.0000 mg | Freq: Four times a day (QID) | INTRAVENOUS | Status: DC | PRN
  Filled 2013-12-17: qty 5

## 2013-12-17 MED ORDER — ONDANSETRON HCL 4 MG/2ML IJ SOLN
4.0000 mg | Freq: Four times a day (QID) | INTRAMUSCULAR | Status: DC | PRN
  Administered 2013-12-17: 4 mg via INTRAVENOUS
  Filled 2013-12-17: qty 2

## 2013-12-17 MED ORDER — DEXAMETHASONE SODIUM PHOSPHATE 10 MG/ML IJ SOLN
INTRAMUSCULAR | Status: DC | PRN
  Administered 2013-12-17: 10 mg via INTRAVENOUS

## 2013-12-17 MED ORDER — ROCURONIUM BROMIDE 100 MG/10ML IV SOLN
INTRAVENOUS | Status: AC
  Filled 2013-12-17: qty 1

## 2013-12-17 MED ORDER — KETOROLAC TROMETHAMINE 30 MG/ML IJ SOLN
15.0000 mg | Freq: Once | INTRAMUSCULAR | Status: AC | PRN
  Administered 2013-12-17: 30 mg via INTRAVENOUS

## 2013-12-17 MED ORDER — BUPIVACAINE HCL (PF) 0.25 % IJ SOLN
INTRAMUSCULAR | Status: AC
  Filled 2013-12-17: qty 60

## 2013-12-17 MED ORDER — CEFAZOLIN SODIUM-DEXTROSE 2-3 GM-% IV SOLR
INTRAVENOUS | Status: AC
  Filled 2013-12-17: qty 50

## 2013-12-17 MED ORDER — MIDAZOLAM HCL 2 MG/2ML IJ SOLN
INTRAMUSCULAR | Status: AC
  Filled 2013-12-17: qty 2

## 2013-12-17 MED ORDER — PROMETHAZINE HCL 25 MG/ML IJ SOLN: 6.2500 mg | INTRAMUSCULAR | Status: DC | PRN

## 2013-12-17 MED ORDER — MIDAZOLAM HCL 5 MG/5ML IJ SOLN
INTRAMUSCULAR | Status: DC | PRN
  Administered 2013-12-17: 2 mg via INTRAVENOUS

## 2013-12-17 MED ORDER — HYDROCODONE-ACETAMINOPHEN 5-325 MG PO TABS
1.0000 | ORAL_TABLET | ORAL | Status: DC | PRN
  Administered 2013-12-17 – 2013-12-18 (×5): 2 via ORAL
  Filled 2013-12-17 (×5): qty 2

## 2013-12-17 MED ORDER — PROPOFOL 10 MG/ML IV BOLUS
INTRAVENOUS | Status: AC
  Filled 2013-12-17: qty 20

## 2013-12-17 MED ORDER — CEFAZOLIN SODIUM-DEXTROSE 2-3 GM-% IV SOLR
2.0000 g | Freq: Four times a day (QID) | INTRAVENOUS | Status: AC
  Administered 2013-12-17 (×2): 2 g via INTRAVENOUS
  Filled 2013-12-17 (×3): qty 50

## 2013-12-17 MED ORDER — OXYCODONE HCL 5 MG PO TABS: 5.0000 mg | ORAL_TABLET | ORAL | Status: DC | PRN

## 2013-12-17 MED ORDER — DOCUSATE SODIUM 100 MG PO CAPS
100.0000 mg | ORAL_CAPSULE | Freq: Two times a day (BID) | ORAL | Status: DC
  Administered 2013-12-17 – 2013-12-18 (×2): 100 mg via ORAL

## 2013-12-17 MED ORDER — METHOCARBAMOL 500 MG PO TABS: 500.0000 mg | ORAL_TABLET | Freq: Four times a day (QID) | ORAL | Status: DC | PRN

## 2013-12-17 MED ORDER — PROPOFOL 10 MG/ML IV BOLUS
INTRAVENOUS | Status: DC | PRN
  Administered 2013-12-17: 200 mg via INTRAVENOUS

## 2013-12-17 MED ORDER — KETOROLAC TROMETHAMINE 30 MG/ML IJ SOLN
INTRAMUSCULAR | Status: AC
  Filled 2013-12-17: qty 1

## 2013-12-17 MED ORDER — HYDROMORPHONE HCL PF 2 MG/ML IJ SOLN
INTRAMUSCULAR | Status: AC
  Filled 2013-12-17: qty 1

## 2013-12-17 MED ORDER — CEFAZOLIN SODIUM-DEXTROSE 2-3 GM-% IV SOLR
2.0000 g | INTRAVENOUS | Status: AC
  Administered 2013-12-17: 2 g via INTRAVENOUS

## 2013-12-17 MED ORDER — KETOROLAC TROMETHAMINE 30 MG/ML IJ SOLN
30.0000 mg | Freq: Four times a day (QID) | INTRAMUSCULAR | Status: DC
  Administered 2013-12-17 – 2013-12-18 (×3): 30 mg via INTRAVENOUS
  Filled 2013-12-17: qty 1
  Filled 2013-12-17: qty 2
  Filled 2013-12-17 (×3): qty 1
  Filled 2013-12-17: qty 2
  Filled 2013-12-17 (×3): qty 1

## 2013-12-17 MED ORDER — DIPHENHYDRAMINE HCL 12.5 MG/5ML PO ELIX: 12.5000 mg | ORAL_SOLUTION | ORAL | Status: DC | PRN

## 2013-12-17 MED ORDER — HYDROMORPHONE HCL PF 1 MG/ML IJ SOLN
INTRAMUSCULAR | Status: AC
  Filled 2013-12-17: qty 1

## 2013-12-17 MED ORDER — FENTANYL CITRATE 0.05 MG/ML IJ SOLN
INTRAMUSCULAR | Status: AC
  Filled 2013-12-17: qty 5

## 2013-12-17 MED ORDER — HYDROMORPHONE HCL PF 1 MG/ML IJ SOLN
0.5000 mg | INTRAMUSCULAR | Status: DC | PRN
  Administered 2013-12-17: 1 mg via INTRAVENOUS
  Filled 2013-12-17: qty 1

## 2013-12-17 MED ORDER — ONDANSETRON HCL 4 MG PO TABS: 4.0000 mg | ORAL_TABLET | Freq: Four times a day (QID) | ORAL | Status: DC | PRN

## 2013-12-17 MED ORDER — SODIUM CHLORIDE 0.9 % IR SOLN
Status: DC | PRN
  Administered 2013-12-17: 13:00:00

## 2013-12-17 MED ORDER — METHOCARBAMOL 500 MG PO TABS
500.0000 mg | ORAL_TABLET | Freq: Four times a day (QID) | ORAL | Status: DC | PRN
  Administered 2013-12-17: 500 mg via ORAL
  Filled 2013-12-17: qty 1

## 2013-12-17 MED ORDER — ENOXAPARIN SODIUM 40 MG/0.4ML ~~LOC~~ SOLN
40.0000 mg | SUBCUTANEOUS | Status: DC
  Administered 2013-12-18: 40 mg via SUBCUTANEOUS
  Filled 2013-12-17 (×2): qty 0.4

## 2013-12-17 MED ORDER — METOCLOPRAMIDE HCL 10 MG PO TABS
5.0000 mg | ORAL_TABLET | Freq: Three times a day (TID) | ORAL | Status: DC | PRN
Start: 2013-12-17 — End: 2013-12-18

## 2013-12-17 MED ORDER — MAGNESIUM CITRATE PO SOLN: 1.0000 | Freq: Once | ORAL | Status: AC | PRN

## 2013-12-17 MED ORDER — FENTANYL CITRATE 0.05 MG/ML IJ SOLN
INTRAMUSCULAR | Status: DC | PRN
  Administered 2013-12-17 (×5): 50 ug via INTRAVENOUS

## 2013-12-17 MED ORDER — POLYETHYLENE GLYCOL 3350 17 G PO PACK
17.0000 g | PACK | Freq: Every day | ORAL | Status: DC | PRN
  Administered 2013-12-17: 17 g via ORAL

## 2013-12-17 MED ORDER — DOCUSATE SODIUM 100 MG PO CAPS: 100.0000 mg | ORAL_CAPSULE | Freq: Two times a day (BID) | ORAL | Status: DC

## 2013-12-17 MED ORDER — OXYCODONE-ACETAMINOPHEN 7.5-325 MG PO TABS: 1.0000 | ORAL_TABLET | ORAL | Status: DC | PRN

## 2013-12-17 SURGICAL SUPPLY — 48 items
BAG SPEC THK2 15X12 ZIP CLS (MISCELLANEOUS) ×1
BAG ZIPLOCK 12X15 (MISCELLANEOUS) ×3 IMPLANT
BANDAGE GAUZE ELAST BULKY 4 IN (GAUZE/BANDAGES/DRESSINGS) ×2 IMPLANT
BIT DRILL 2.5X2.75 QC CALB (BIT) ×2 IMPLANT
BIT DRILL PL 2.7MM IMPLANT
BNDG GAUZE ELAST 4 BULKY (GAUZE/BANDAGES/DRESSINGS) ×3 IMPLANT
CHLORAPREP W/TINT 26ML (MISCELLANEOUS) ×4 IMPLANT
CLOTH 2% CHLOROHEXIDINE 3PK (PERSONAL CARE ITEMS) ×3 IMPLANT
CUFF TOURN SGL QUICK 34 (TOURNIQUET CUFF) ×3
CUFF TRNQT CYL 34X4X40X1 (TOURNIQUET CUFF) ×1 IMPLANT
DRAPE C-ARM 42X120 X-RAY (DRAPES) ×3 IMPLANT
DRAPE POUCH INSTRU U-SHP 10X18 (DRAPES) ×3 IMPLANT
DRAPE U-SHAPE 47X51 STRL (DRAPES) ×3 IMPLANT
DRSG ADAPTIC 3X8 NADH LF (GAUZE/BANDAGES/DRESSINGS) ×3 IMPLANT
DRSG EMULSION OIL 3X3 NADH (GAUZE/BANDAGES/DRESSINGS) ×2 IMPLANT
DRSG PAD ABDOMINAL 8X10 ST (GAUZE/BANDAGES/DRESSINGS) ×3 IMPLANT
DURAPREP 26ML APPLICATOR (WOUND CARE) ×5 IMPLANT
ELECT REM PT RETURN 9FT ADLT (ELECTROSURGICAL) ×3
ELECTRODE REM PT RTRN 9FT ADLT (ELECTROSURGICAL) ×1 IMPLANT
GLOVE BIOGEL PI IND STRL 7.5 (GLOVE) ×1 IMPLANT
GLOVE BIOGEL PI IND STRL 8 (GLOVE) ×1 IMPLANT
GLOVE BIOGEL PI INDICATOR 7.5 (GLOVE) ×4
GLOVE BIOGEL PI INDICATOR 8 (GLOVE) ×4
GLOVE SURG SS PI 7.5 STRL IVOR (GLOVE) ×5 IMPLANT
GLOVE SURG SS PI 8.0 STRL IVOR (GLOVE) ×6 IMPLANT
GOWN STRL REUS W/TWL LRG LVL3 (GOWN DISPOSABLE) ×5 IMPLANT
GOWN STRL REUS W/TWL XL LVL3 (GOWN DISPOSABLE) ×8 IMPLANT
KIT BASIN OR (CUSTOM PROCEDURE TRAY) ×3 IMPLANT
MANIFOLD NEPTUNE II (INSTRUMENTS) ×3 IMPLANT
NEEDLE HYPO 22GX1.5 SAFETY (NEEDLE) ×3 IMPLANT
PACK LOWER EXTREMITY WL (CUSTOM PROCEDURE TRAY) ×3 IMPLANT
PAD CAST 4YDX4 CTTN HI CHSV (CAST SUPPLIES) ×2 IMPLANT
PADDING CAST COTTON 4X4 STRL (CAST SUPPLIES) ×6
PLATE ACE 100DEG 6HOLE (Plate) ×2 IMPLANT
POSITIONER SURGICAL ARM (MISCELLANEOUS) ×3 IMPLANT
PROS DRILL BIT PL 2.7MM ×3 IMPLANT
REPAIR TROPE KNTLS SYNDESMOSIS (Orthopedic Implant) ×2 IMPLANT
SCREW CORTICAL 3.5MM  12MM (Screw) ×10 IMPLANT
SCREW CORTICAL 3.5MM 12MM (Screw) IMPLANT
SPLINT FIBERGLASS 4X30 (CAST SUPPLIES) ×2 IMPLANT
SPONGE GAUZE 4X4 12PLY (GAUZE/BANDAGES/DRESSINGS) ×3 IMPLANT
SUCTION FRAZIER 12FR DISP (SUCTIONS) ×3 IMPLANT
SUT ETHILON 4 0 PS 2 18 (SUTURE) ×6 IMPLANT
SUT VIC AB 1 CT1 27 (SUTURE) ×6
SUT VIC AB 1 CT1 27XBRD ANTBC (SUTURE) ×2 IMPLANT
SUT VIC AB 2-0 CT1 27 (SUTURE) ×3
SUT VIC AB 2-0 CT1 TAPERPNT 27 (SUTURE) ×1 IMPLANT
SYR CONTROL 10ML LL (SYRINGE) ×3 IMPLANT

## 2013-12-17 NOTE — Discharge Instructions (Signed)
Remain nonweightbearing right leg, assistance with crutches or kneeling scooter Ice and elevate right leg to reduce swelling, toes above the nose, at least 5-6x/day 20-30 min at a time Take Aspirin 325mg  daily to reduce blood clots Keep cast clean and dry Follow up in 2 weeks for staple removal and xrays in the office, call for appt

## 2013-12-17 NOTE — Transfer of Care (Signed)
Immediate Anesthesia Transfer of Care Note  Patient: Sandy [name]  Procedure(s) Performed: Procedure(s) (LRB): OPEN REDUCTION INTERNAL FIXATION (ORIF) RIGHT  ANKLE FRACTURE WITH SYNDESMOSIS SENSATION (Right)  Patient Location: PACU  Anesthesia Type: General  Level of Consciousness: sedated, patient cooperative and responds to stimulation  Airway & Oxygen Therapy: Patient Spontanous Breathing and Patient connected to face mask oxgen  Post-op Assessment: Report given to PACU RN and Post -op Vital signs reviewed and stable  Post vital signs: Reviewed and stable  Complications: No apparent anesthesia complications

## 2013-12-17 NOTE — Interval H&P Note (Signed)
History and Physical Interval Note:  12/17/2013 7:35 AM  Sandy [name]  has presented today for surgery, with the diagnosis of RIGHT ANKLE FRACTURE DISLOCATION  The various methods of treatment have been discussed with the patient and family. After consideration of risks, benefits and other options for treatment, the patient has consented to  Procedure(s): OPEN REDUCTION INTERNAL FIXATION (ORIF) RIGHT  ANKLE FRACTURE WITH SYNDESMOSIS SENSATION (Right) as a surgical intervention .  The patient's history has been reviewed, patient examined, no change in status, stable for surgery.  I have reviewed the patient's chart and labs.  Questions were answered to the patient's satisfaction.     Johnn Hai

## 2013-12-17 NOTE — Brief Op Note (Signed)
12/17/2013  1:27 PM  PATIENT:  Sandy [name]  38 y.o. female  PRE-OPERATIVE DIAGNOSIS:  RIGHT ANKLE FRACTURE DISLOCATION  POST-OPERATIVE DIAGNOSIS:  RIGHT ANKLE FRACTURE DISLOCATION  PROCEDURE:  Procedure(s): OPEN REDUCTION INTERNAL FIXATION (ORIF) RIGHT  ANKLE FRACTURE WITH SYNDESMOSIS SENSATION (Right)  SURGEON:  Surgeon(s) and Role:    * Johnn Hai, MD - Primary  PHYSICIAN ASSISTANT:   ASSISTANTS: [place]   ANESTHESIA:   general  EBL:     BLOOD ADMINISTERED:none  DRAINS: none   LOCAL MEDICATIONS USED:  MARCAINE     SPECIMEN:  No Specimen  DISPOSITION OF SPECIMEN:  N/A  COUNTS:  YES  TOURNIQUET:  * Missing tourniquet times found for documented tourniquets in log:  [id number] *  DICTATION: .Other Dictation: Dictation Number  [id number]  PLAN OF CARE: Admit for overnight observation  PATIENT DISPOSITION:  PACU - hemodynamically stable.   Delay start of Pharmacological VTE agent (>24hrs) due to surgical blood loss or risk of bleeding: no

## 2013-12-17 NOTE — Anesthesia Postprocedure Evaluation (Signed)
  Anesthesia Post-op Note  Patient: Sandy [name]  Procedure(s) Performed: Procedure(s) (LRB): OPEN REDUCTION INTERNAL FIXATION (ORIF) RIGHT  ANKLE FRACTURE WITH SYNDESMOSIS SENSATION (Right)  Patient Location: PACU  Anesthesia Type: General  Level of Consciousness: awake and alert   Airway and Oxygen Therapy: Patient Spontanous Breathing  Post-op Pain: mild  Post-op Assessment: Post-op Vital signs reviewed, Patient's Cardiovascular Status Stable, Respiratory Function Stable, Patent Airway and No signs of Nausea or vomiting  Last Vitals:  Filed Vitals:   12/17/13 1512  BP:   Pulse: 81  Temp: 36.7 C  Resp: 13    Post-op Vital Signs: stable   Complications: No apparent anesthesia complications

## 2013-12-17 NOTE — Care Management Note (Addendum)
    Page 1 of 2   12/18/2013     11:44:19 AM CARE MANAGEMENT NOTE 12/18/2013  Patient:  FEDORA, KNISELY   Account Number:  [id number]  Date Initiated:  12/17/2013  Documentation initiated by:  [facility]  Subjective/Objective Assessment:   adm: R ankle  pain; OPEN REDUCTION INTERNAL FIXATION(ORIF) R ANKLE FX W SYNDESMOSIS SENSATION     Action/Plan:   discharge planning   Anticipated DC Date:  12/18/2013   Anticipated DC Plan:  [place]  CM consult      [facility] Choice  [place]   Choice offered to / List presented to:  C-1 Patient   DME arranged  [place]  3-N-1      DME agency  [place] arranged  [place]   Status of service:  Completed, signed off Medicare Important Message given?   (If response is "NO", the following Medicare IM given date fields will be blank) Date Medicare IM given:   Date Additional Medicare IM given:    Discharge Disposition:  [place]  Per UR Regulation:  Reviewed for med. necessity/level of care/duration of stay  If discussed at [place] of Stay Meetings, dates discussed:    Comments:  12/17/13 16:00 CM spoke with pt and gave her a Resource List to secure a PCP post discharge.  Pt will work with PT/OT and CM waiting for evals/orders for potential [facility] services. CM will follow for disposition needs.  Mariane Masters, BSN, CM [phone].

## 2013-12-17 NOTE — Anesthesia Preprocedure Evaluation (Signed)
Anesthesia Evaluation  Patient identified by MRN, date of birth, ID band Patient awake    Reviewed: Allergy & Precautions, H&P , NPO status , Patient's Chart, lab work & pertinent test results  Airway Mallampati: II TM Distance: <3 FB Neck ROM: Full    Dental no notable dental hx.    Pulmonary neg pulmonary ROS, former smoker,  breath sounds clear to auscultation  Pulmonary exam normal       Cardiovascular negative cardio ROS  Rhythm:Regular Rate:Normal     Neuro/Psych negative neurological ROS  negative psych ROS   GI/Hepatic negative GI ROS, Neg liver ROS,   Endo/Other  Morbid obesity  Renal/GU negative Renal ROS  negative genitourinary   Musculoskeletal negative musculoskeletal ROS (+)   Abdominal   Peds negative pediatric ROS (+)  Hematology negative hematology ROS (+)   Anesthesia Other Findings   Reproductive/Obstetrics negative OB ROS                           Anesthesia Physical Anesthesia Plan  ASA: II  Anesthesia Plan: General   Post-op Pain Management:    Induction: Intravenous  Airway Management Planned: Oral ETT  Additional Equipment:   Intra-op Plan:   Post-operative Plan: Extubation in OR  Informed Consent: I have reviewed the patients History and Physical, chart, labs and discussed the procedure including the risks, benefits and alternatives for the proposed anesthesia with the patient or authorized representative who has indicated his/her understanding and acceptance.   Dental advisory given  Plan Discussed with: CRNA and Surgeon  Anesthesia Plan Comments:         Anesthesia Quick Evaluation

## 2013-12-17 NOTE — H&P (View-Only) (Signed)
Sandy [name] DOB: Oct 05, 1975  Chief Complaint: R ankle pain  History of Present Illness The patient is a 38 year old female who presents with ankle complaints. The patient reports right ankle symptoms which began 1 day(s) ago following a specific injury. The injury occurred 1 day(s) ago due to a fall (missed a step). Symptoms are reported to be located in the right ankle and include ankle pain and swelling. Prior to being seen today the patient was previously evaluated in the emergency room. Previous work-up for this problem has included ankle x-rays. Past treatment for this problem has included posterior splint (following reduction of right ankle fracture) and crutches (non-weight bearing). Note for "Ankle pain": Sandy [name] presents for eval of R ankle pain following a fall on 5/17 at a friend's house, down steps. She experienced sudden severe pain with a notable deformity of the ankle, presented to the ER where she was found to have a fx/dislocation, which was reduced and splinted. She was discharged but returned early AM 5/18 due to inadequate pain control and was given Rx for Percocet, which has been helping more for her pain. She is noting some cramping in her R lower leg. Denies numbness, tingling, other injuries from her fall. She had a fall down the same steps several years ago with a less severe injury that was never treated, but she did limp for a while after that. She does work from home.  Allergies No Known Drug Allergies. 12/14/2013  Social History Children. 0 Current drinker. 12/14/2013: Currently drinks wine less than 5 times per week Current work status. working full time Exercise. Exercises daily; does gym / weights Living situation. live with partner Marital status. single No history of drug/alcohol rehab Tobacco / smoke exposure. 12/14/2013: no Tobacco use. Former smoker. 12/14/2013: smoke(d) less than 1/2 pack(s) per day uses less than 1/2 can(s) smokeless  per week Under pain contract  Medication History Advil (200MG  Capsule, 1 (one) Oral) Active. Percocet (5-325MG  Tablet, Oral) Active.  Pregnancy / Birth History Pregnant. no  Past Surgical History Gallbladder Surgery. laporoscopic  Review of Systems General:Present- Weight Gain. Not Present- Chills, Fever, Night Sweats, Appetite Loss, Fatigue, Feeling sick and Weight Loss. Skin:Not Present- Itching, Rash, Skin Color Changes, Ulcer, Psoriasis and Change in Hair or Nails. HEENT:Not Present- Sensitivity to light, Hearing problems, Nose Bleed and Ringing in the Ears. Neck:Not Present- Swollen Glands and Neck Mass. Respiratory:Not Present- Snoring, Chronic Cough, Bloody sputum and Dyspnea. Cardiovascular:Not Present- Shortness of Breath, Chest Pain, Swelling of Extremities, Leg Cramps and Palpitations. Gastrointestinal:Not Present- Bloody Stool, Heartburn, Abdominal Pain, Vomiting, Nausea and Incontinence of Stool. Female Genitourinary:Not Present- Blood in Urine, Menstrual Irregularities, Frequency, Incontinence and Nocturia. Musculoskeletal:Present- Joint Stiffness, Joint Swelling, Joint Pain and Back Pain. Not Present- Muscle Weakness and Muscle Pain. Neurological:Not Present- Tingling, Numbness, Burning, Tremor, Headaches and Dizziness. Psychiatric:Not Present- Anxiety, Depression and Memory Loss. Endocrine:Not Present- Cold Intolerance, Heat Intolerance, Excessive hunger and Excessive Thirst. Hematology:Not Present- Abnormal Bleeding, Anemia, Blood Clots and Easy Bruising.  Vitals 12/14/2013 11:15 AM Weight: 240 lb Height: 61 in Body Surface Area: 2.16 m Body Mass Index: 45.35 kg/m BP: 129/83 (Sitting, Right Arm, Standard)  Physical Exam The physical exam findings are as follows:  General Mental Status - Alert. General Appearance- pleasant and In acute distress (mild). Orientation- Oriented X3. Build & Nutrition- Well nourished. Gait- Use of  assistive device (wheelchair, crutches). Mental Status- Alert.  Peripheral Vascular Lower Extremity: Palpation:Tenderness- Right- no calf tenderness to palpation. Posterior tibial pulse- Bilateral- 2+.  Dorsalis pedis pulse- Bilateral- 2+.  Neurologic Sensation:Lower Extremity- Right- sensation is intact in the lower extremity, unless otherwise mentioned.  Musculoskeletal Lower Extremity Right Lower Extremity: Right Ankle: Inspection and Palpation:Clinical Contours- Note: splint in place R ankle Tenderness- medial malleolus tender to palpation, distal fibula tender to palpation, deltoid ligament tender to palpation and syndesmosis tender to palpation. no tenderness to palpation of the Achilles tendon, no tenderness to palpation of the calcaneus, no tenderness to palpation of the base of the 5th metatarsal and no tenderness to palpation of the calf. Swelling- moderate. Tissue tension/texture is - soft. Sensation is - normal. Strength and Tone:Testing limited- Note: known fx QJJ:HERDEYC limited- Note: due to pain and known fx, not evaluated Right Foot: Inspection and Palpation:Tenderness- no tenderness to palpation. Swelling- none.  Imaging pre and post-reduction xrays R ankle [facility] system reviewed. Pre-reduction xrays with anteromedial ankle dislocation, displaced and angulated distal fibular shaft fx. Small chronic calcification medial malleolus, likely due to prior injury. Post-reduction xrays with successful reduction of ankle dislocation with improved alignment of distal fibular shaft fx, minimally displaced. There is widening of the mortise and syndesmosis noted. Splint in place.  Assessment & Plan R ankle fx/dislocation  Pt 1 day s/p R ankle fx/dislocation following fall, closed reduction and splinting performed in ER, pain controlled with Percocet. Discussed the nature of her injury, reviewed relevant anatomy. Given her dislocation and disruption of the  mortise and syndesmosis, recommend proceeding with ORIF R ankle with syndesmosis fixation. Discussed possible need for plating her fibular shaft fx as well depending on stability intra-operatively, she may require only syndesmosis fixation and possibly deltoid repair. Discussed the procedure itself as well as risks, complications, and alternatives including but not limited to DVT, PE, infx, bleeding, failure of procedure, need for secondary procedure, anesthesia risk, even death. Discussed post-op protocols, NWB status potentially up to 3 months to allow full healing, time out of work, DVT ppx, casting, pain medications. In the interim, recommend aggressive ice and elevation, toes above the nose, to reduce swelling and lessen the chance for her to develop fx blisters which could delay surgery. She will remain in her splint, continue Percocet for pain, refilled Rx, added Robaxin for spasms as well. Plan to proceed with surgery this week. All her questions were answered. Denies hx of DVT or MRSA. She is otherwise healthy. She will follow up 10-14 days post-op for staple removal and xrays and will call with any questions or concerns in the interim.  She apparently saw Dr. Gladstone Lighter on 5/20 due to pain and swelling, new splint was applied with relief.  Plan R ankle ORIF with syndesmosis fixation  Signed electronically by Lacie Draft PA-C for Dr. Tonita Cong

## 2013-12-18 ENCOUNTER — Encounter (HOSPITAL_COMMUNITY): Payer: Self-pay | Admitting: Specialist

## 2013-12-18 NOTE — Op Note (Signed)
[name], [name] NO.:  [id number]  MEDICAL RECORD NO.:  [id number]  LOCATION:  13                         FACILITY:  [facility]  PHYSICIAN:  Susa Day, M.D.    DATE OF BIRTH:  March 27, 1976  DATE OF PROCEDURE:  12/17/2013 DATE OF DISCHARGE:                              OPERATIVE REPORT   PREOPERATIVE DIAGNOSIS:  Fracture dislocation of the ankle with rupture of the syndesmosis.  POSTOPERATIVE DIAGNOSIS:  Fracture dislocation of the ankle with rupture of the syndesmosis.  PROCEDURE PERFORMED: 1. Open reduction and internal fixation of bimalleolar ankle fracture. 2. Open reduction and internal fixation of syndesmosis rupture. 3. Stress radiographs under anesthesia.  SURGEON:  Susa Day, M.D.  ASSISTANT:  Cleophas Dunker, PA.  BRIEF HISTORY:  This is a pleasant 38 year old female with fracture dislocation of the ankle with widening of the syndesmosis, reduced in the emergency room.  The patient was placed in a splint.  She was indicated for ORIF of bimalleolar ankle fracture, avulsion medially and predominantly a fibular fracture and widening of the syndesmosis.  Risks and benefits were discussed including bleeding, infection, damage to vascular structures, DVT, PE, anesthetic complications, etc.  TECHNIQUE:  The patient in supine position, after induction of adequate general anesthesia and 2 g of Kefzol, the right lower extremity was prepped and draped in the usual sterile fashion.  We removed the splint. She had some abrasions over the anterior aspect of the tibia laterally consistent with lines at this point.  The lower extremity was prepped and draped in the usual sterile fashion.  Thigh tourniquet inflated to 300 mmHg.  A midline incision was then made over the fibula at its fracture, which was above the ankle joint of approximately 10 cm. Subcutaneous tissue was dissected.  Electrocautery was utilized to achieve hemostasis.  We palpated the  fracture of the fibula.  We incised through the fascia.  Peroneal musculatures were reflected and protected the superficial peroneal nerve, all neurovascular structures.  We identified the fractured fibula, reduced and held it with a tenaculum. Placed a 6-hole plate over the fibula and fixed it with 5 fully threaded cortical screws after appropriate drilling depth, gauge measurement, insertion of the screw.  12 mm were utilized as anatomic.  Then, we turned our attention distally towards completing the repair of the syndesmosis.  An incision over the distal fibula 1.5 cm above the ankle mortise.  Blunt dissection down to the fibula.  Under x-ray, we drilled 4 cortices from posterior to anterior parallel to the joint surface. Then, we inserted a TightRope device through this drill channel placing the foot plate of the device on the medial aspect of the tibia using the sliding footplate over the fibula and a combination sutures to reduce and compress the fibula into the tibia, into the recess of the syndesmosis.  This reduced it anatomically.  Foot was in neutral position.  Redundant sutures were then removed through the medial stab wound as well as laterally.  We then did stress radiographs under anesthesia.  Full dorsiflexion, plantarflexion, eversion, and inversion. There was no widening of the syndesmosis.  The fibula was at length. There was no widening medially.  We felt there was excellent  reduction. There was no defect, I think it is from the soft tissue swelling noted medially, I feel we had opened the ankle joint medially.  Negative anterior drawer.  All wounds were copiously irrigated.  Peroneal nerve intact.  We closed the subcutaneous tissue with 2-0 Vicryl after copious irrigation, the skin with staples proximally as well as distally.  A 6 hole third tubular plate was used to fix the fibula after its reduction.  After the tourniquet was deflated, there was adequate  revascularization of lower extremity appreciated.  We placed a short-leg cast well-molded fiberglass, slight plantar flexion.  0.25% Marcaine was infiltrated in the wound.  Following that, she was awoken without difficulty and transported to the recovery room in satisfactory condition.  The patient tolerated the procedure well.  No complications.  No blood loss.     Susa Day, M.D.     Sandy [name]  D:  12/17/2013  T:  12/18/2013  Job:  [id number]

## 2013-12-18 NOTE — Evaluation (Signed)
Occupational Therapy Evaluation Patient Details Name: Kyanna Mahrt MRN: [id number] DOB: 09-03-1975 Today's Date: 12/18/2013    History of Present Illness R ankle fx, s/p ORIF   Clinical Impression   Pt presents to OT s/p ankle fracture abe she is NWB. Education complete regarding ADL activity with WB status             Precautions / Restrictions Precautions Precautions: Fall Restrictions Weight Bearing Restrictions: Yes RLE Weight Bearing: Non weight bearing      Mobility Bed Mobility Overal bed mobility: Modified Independent             General bed mobility comments: HOB up, used rail  Transfers Overall transfer level: Needs assistance Equipment used: Rolling walker (2 wheeled) Transfers: Sit to/from Stand Sit to Stand: Modified independent (Device/Increase time)         General transfer comment: VCs hand placement    Balance Overall balance assessment: Needs assistance   Sitting balance-Leahy Scale: Good       Standing balance-Leahy Scale: Poor Standing balance comment: requires BUE support in standing                            ADL Overall ADL's : Needs assistance/impaired Eating/Feeding: Independent   Grooming: Independent   Upper Body Bathing: Independent   Lower Body Bathing: Min guard;Sit to/from stand   Upper Body Dressing : Set up;Sitting   Lower Body Dressing: Sit to/from stand;Min guard   Toilet Transfer: Modified Independent   Toileting- Clothing Manipulation and Hygiene: Modified independent         General ADL Comments: educated pt on safety with ADL activity.           Hand Dominance     Extremity/Trunk Assessment Upper Extremity Assessment Upper Extremity Assessment: Overall WFL for tasks assessed   Lower Extremity Assessment Lower Extremity Assessment: RLE deficits/detail RLE Deficits / Details: knee ext at least 3/5, able to do SLR, can wiggle toes, ankle NT   Cervical / Trunk  Assessment Cervical / Trunk Assessment: Normal   Communication Communication Communication: No difficulties   Cognition Arousal/Alertness: Awake/alert Behavior During Therapy: WFL for tasks assessed/performed Overall Cognitive Status: Within Functional Limits for tasks assessed                                Home Living Family/patient expects to be discharged to:: Private residence Living Arrangements: Spouse/significant other Available Help at Discharge: Available PRN/intermittently (will have 24* help next week from mother, boyfriend travels)   Home Access: Stairs to enter [employer] of Steps: 5 Entrance Stairs-Rails: Right (uses wall on other side) Home Layout: Two level;Bed/bath upstairs Alternate Level Stairs-Number of Steps: flight Alternate Level Stairs-Rails: Right           Home Equipment: Crutches          Prior Functioning/Environment Level of Independence: Independent                      OT Goals(Current goals can be found in the care plan section) Acute Rehab OT Goals Patient Stated Goal: to be able to get around, 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, works from home  OT Frequency:     Barriers to D/C:            Co-evaluation              End  of Session  Pt left sitting EOB with call bell with in reach     Time: 1000-1019 OT Time Calculation (min): 19 min Charges:  OT General Charges $OT Visit: 1 Procedure OT Evaluation $Initial OT Evaluation Tier I: 1 Procedure OT Treatments $Self Care/Home Management : 8-22 mins G-Codes: OT G-codes **NOT FOR INPATIENT CLASS** Functional Assessment Tool Used: cinical observation Functional Limitation: Self care Self Care Current Status (X5400): At least 20 percent but less than 40 percent impaired, limited or restricted Self Care Goal Status (Q6761): At least 1 percent but less than 20 percent impaired, limited or restricted Self Care Discharge Status [phone]): At least 20  percent but less than 40 percent impaired, limited or restricted  Betsy Pries 12/18/2013, 11:18 AM

## 2013-12-18 NOTE — Evaluation (Signed)
Physical Therapy Evaluation Patient Details Name: Sandy [name] MRN: [id number] DOB: July 23, 1976 Today's Date: 12/18/2013   History of Present Illness  R ankle fx, s/p ORIF  Clinical Impression  **Pt admitted with *R ankle fx, s/p ORIF*. Pt currently with functional limitations due to the deficits listed below (see PT Problem List).  Pt will benefit from skilled PT to increase their independence and safety with mobility to allow discharge to the venue listed below.   *    Follow Up Recommendations Home health PT    Equipment Recommendations  Rolling walker with 5" wheels;3in1 (PT)    Recommendations for Other Services       Precautions / Restrictions Precautions Precautions: Fall Restrictions Weight Bearing Restrictions: Yes RLE Weight Bearing: Non weight bearing      Mobility  Bed Mobility Overal bed mobility: Modified Independent             General bed mobility comments: HOB up, used rail  Transfers Overall transfer level: Needs assistance Equipment used: Rolling walker (2 wheeled) Transfers: Sit to/from Stand Sit to Stand: Min guard         General transfer comment: VCs hand placement, min/guard safety  Ambulation/Gait Ambulation/Gait assistance: Min guard Ambulation Distance (Feet): 55 Feet Assistive device: Rolling walker (2 wheeled)     Gait velocity interpretation: Below normal speed for age/gender General Gait Details: Tried knee scooter, pt didn't like it. Tried SW and RW per pt request, she preferred RW. Distance limited by LLE fatigue.   Stairs Stairs: Yes Stairs assistance: Min assist Stair Management: One rail Left;Step to pattern;Forwards;With crutches Number of Stairs: 2 General stair comments: min A for safety, cues for sequencing  Wheelchair Mobility    Modified Rankin (Stroke Patients Only)       Balance Overall balance assessment: Needs assistance   Sitting balance-Leahy Scale: Good       Standing balance-Leahy  Scale: Poor Standing balance comment: requires BUE support in standing                             Pertinent Vitals/Pain *6/10 R ankle with walking Premedicated, ice applied, RLE elevated**    Home Living Family/patient expects to be discharged to:: Private residence Living Arrangements: Spouse/significant other Available Help at Discharge: Available PRN/intermittently (will have 24* help next week from mother, boyfriend travels)   Home Access: Stairs to enter Entrance Stairs-Rails: Right (uses wall on other side) Entrance Stairs-Number of Steps: 5 Home Layout: Two level;Bed/bath upstairs Home Equipment: Crutches      Prior Function Level of Independence: Independent               Hand Dominance        Extremity/Trunk Assessment   Upper Extremity Assessment: Overall WFL for tasks assessed           Lower Extremity Assessment: RLE deficits/detail RLE Deficits / Details: knee ext at least 3/5, able to do SLR, can wiggle toes, ankle NT    Cervical / Trunk Assessment: Normal  Communication   Communication: No difficulties  Cognition Arousal/Alertness: Awake/alert Behavior During Therapy: WFL for tasks assessed/performed Overall Cognitive Status: Within Functional Limits for tasks assessed                      General Comments      Exercises General Exercises - Lower Extremity Quad Sets: AROM;Right;5 reps;Supine Gluteal Sets: AROM;Right;Supine Heel Slides: AROM;Right;5 reps;Supine Hip ABduction/ADduction: AROM;Right;5 reps;Supine  Straight Leg Raises: AROM;Right;5 reps;Supine      Assessment/Plan    PT Assessment All further PT needs can be met in the next venue of care  PT Diagnosis Difficulty walking;Acute pain   PT Problem List Decreased activity tolerance;Decreased balance;Pain;Decreased knowledge of use of DME;Decreased mobility;Obesity  PT Treatment Interventions     PT Goals (Current goals can be found in the Care Plan  section) Acute Rehab PT Goals Patient Stated Goal: to be able to get around, 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, works from home PT Goal Formulation: No goals set, d/c therapy Time For Goal Achievement: 12/19/13    Frequency     Barriers to discharge        Co-evaluation               End of Session Equipment Utilized During Treatment: Gait belt Activity Tolerance: Patient limited by fatigue Patient left: in chair;with call bell/phone within reach Nurse Communication: Mobility status    Functional Assessment Tool Used: clinical judgement Functional Limitation: Mobility: Walking and moving around Mobility: Walking and Moving Around Current Status [phone]): At least 20 percent but less than 40 percent impaired, limited or restricted Mobility: Walking and Moving Around Goal Status [phone]): At least 20 percent but less than 40 percent impaired, limited or restricted Mobility: Walking and Moving Around Discharge Status [phone]): At least 20 percent but less than 40 percent impaired, limited or restricted    Time: [phone] PT Time Calculation (min): 41 min   Charges:   PT Evaluation $Initial PT Evaluation Tier I: 1 Procedure PT Treatments $Gait Training: 8-22 mins $Therapeutic Exercise: 8-22 mins   PT G Codes:   Functional Assessment Tool Used: clinical judgement Functional Limitation: Mobility: Walking and moving around    [employer] 12/18/2013, 9:45 AM [phone]

## 2013-12-18 NOTE — Progress Notes (Signed)
Subjective: 1 Day Post-Op Procedure(s) (LRB): OPEN REDUCTION INTERNAL FIXATION (ORIF) RIGHT  ANKLE FRACTURE WITH SYNDESMOSIS SENSATION (Right) Patient reports pain as moderate.  Noting medial and lateral ankle pain. Denies numbness or tingling. No other c/o this AM. Would like to go home today.  Objective: Vital signs in last 24 hours: Temp:  [97.6 F (36.4 C)-98.7 F (37.1 C)] 98.5 F (36.9 C) (05/22 0611) Pulse Rate:  [78-99] 79 (05/22 0611) Resp:  [10-18] 16 (05/22 0611) BP: (101-145)/(63-85) 101/67 mmHg (05/22 0611) SpO2:  [94 %-100 %] 100 % (05/22 0611) Weight:  [108.863 kg (240 lb)] 108.863 kg (240 lb) (05/21 1526)  Intake/Output from previous day: 05/21 0701 - 05/22 0700 In: 2620 [P.O.:960; I.V.:1460; IV Piggyback:200] Out: 300 [Urine:300] Intake/Output this shift:     Recent Labs  12/17/13 1605  HGB 11.4*    Recent Labs  12/17/13 1605  WBC 9.2  RBC 4.85  HCT 35.6*  PLT 363    Recent Labs  12/17/13 1605  CREATININE 0.72   No results found for this basename: LABPT, INR,  in the last 72 hours  Neurologically intact ABD soft Neurovascular intact Sensation intact distally Intact pulses distally Dorsiflexion/Plantar flexion intact Incision: dressing C/D/I and no drainage No cellulitis present Compartment soft no calf pain or sign of DVT. cast clean and dry, bivalved  Assessment/Plan: 1 Day Post-Op Procedure(s) (LRB): OPEN REDUCTION INTERNAL FIXATION (ORIF) RIGHT  ANKLE FRACTURE WITH SYNDESMOSIS SENSATION (Right) Advance diet Up with therapy D/C IV fluids Remain NWB with PT Ice and elevation, toes above the nose, to help reduce swelling Follow up 10-14 days for staple removal and xrays ASA for DVT ppx on D/C Will discuss with Dr. Josiah Lobo. [place] 12/18/2013, 7:41 AM

## 2013-12-18 NOTE — Progress Notes (Signed)
Pt to d/c home. AVS reviewed and "My Chart" discussed with pt. Pt capable of verbalizing medications, dressing changes, signs and symptoms of infection, and follow-up appointments. Remains hemodynamically stable. No signs and symptoms of distress. Educated pt to return to ER in the case of SOB, dizziness, or chest pain.  

## 2013-12-18 NOTE — Discharge Summary (Signed)
Patient ID: Ruchel Brandenburger MRN: [id number] DOB/AGE: 36-Apr-1977 36 y.o.  Admit date: 12/17/2013 Discharge date: 12/18/2013  Admission Diagnoses:  Principal Problem:   Ankle fracture, right Active Problems:   Closed right ankle fracture   Discharge Diagnoses:  Same  Past Medical History  Diagnosis Date  . Numbness     OCASIONAL NUMBNESS RT LEG  . Fracture of ankle     RT    Surgeries: Procedure(s): OPEN REDUCTION INTERNAL FIXATION (ORIF) RIGHT  ANKLE FRACTURE WITH SYNDESMOSIS SENSATION on 12/17/2013   Consultants:   none  Discharged Condition: Improved  Hospital Course: Devine Dant is an 38 y.o. female who was admitted 12/17/2013 for operative treatment ofAnkle fracture, right. Patient has severe unremitting pain that affects sleep, daily activities, and work/hobbies. After pre-op clearance the patient was taken to the operating room on 12/17/2013 and underwent  Procedure(s): OPEN REDUCTION INTERNAL FIXATION (ORIF) RIGHT  ANKLE FRACTURE WITH SYNDESMOSIS SENSATION.    Patient was given perioperative antibiotics: Anti-infectives   Start     Dose/Rate Route Frequency Ordered Stop   12/17/13 1800  ceFAZolin (ANCEF) IVPB 2 g/50 mL premix     2 g 100 mL/hr over 30 Minutes Intravenous Every 6 hours 12/17/13 1529 12/18/13 0023   12/17/13 1237  polymyxin B 500,000 Units, bacitracin 50,000 Units in sodium chloride irrigation 0.9 % 500 mL irrigation  Status:  Discontinued       As needed 12/17/13 1238 12/17/13 1330   12/17/13 0915  ceFAZolin (ANCEF) IVPB 2 g/50 mL premix     2 g 100 mL/hr over 30 Minutes Intravenous On call to O.R. 12/17/13 0914 12/17/13 1202       Patient was given sequential compression devices, early ambulation, and chemoprophylaxis to prevent DVT.  Patient benefited maximally from hospital stay and there were no complications.    Recent vital signs: Patient Vitals for the past 24 hrs:  BP Temp Temp src Pulse Resp SpO2 Height Weight  12/18/13 0611  101/67 mmHg 98.5 F (36.9 C) Oral 79 16 100 % - -  12/18/13 0205 127/71 mmHg 98.2 F (36.8 C) Oral 82 16 95 % - -  12/17/13 2117 118/75 mmHg 97.6 F (36.4 C) Oral 99 16 99 % - -  12/17/13 1849 112/63 mmHg 98.7 F (37.1 C) - 95 16 99 % - -  12/17/13 1736 114/67 mmHg 98.3 F (36.8 C) - 78 16 99 % - -  12/17/13 1631 133/76 mmHg 98.2 F (36.8 C) - 90 16 94 % - -  12/17/13 1526 108/67 mmHg 98.3 F (36.8 C) Oral - 13 100 % 5\' 1"  (1.549 m) 108.863 kg (240 lb)  12/17/13 1512 - 98 F (36.7 C) - 81 13 99 % - -  12/17/13 1500 131/70 mmHg - - 81 14 100 % - -  12/17/13 1445 127/70 mmHg - - 80 15 100 % - -  12/17/13 1430 129/76 mmHg - - 87 13 99 % - -  12/17/13 1415 132/74 mmHg - - 89 12 100 % - -  12/17/13 1400 145/85 mmHg - - 92 15 100 % - -  12/17/13 1345 132/74 mmHg - - 94 12 - - -  12/17/13 1335 130/78 mmHg 97.9 F (36.6 C) - 95 10 - - -     Recent laboratory studies:  Recent Labs  12/17/13 1605  WBC 9.2  HGB 11.4*  HCT 35.6*  PLT 363  CREATININE 0.72     Discharge Medications:     Medication  List    STOP taking these medications       oxyCODONE-acetaminophen 5-325 MG per tablet  Commonly known as:  PERCOCET/ROXICET  Replaced by:  oxyCODONE-acetaminophen 7.5-325 MG per tablet      TAKE these medications       docusate sodium 100 MG capsule  Commonly known as:  COLACE  Take 1 capsule (100 mg total) by mouth 2 (two) times daily.     ibuprofen 200 MG tablet  Commonly known as:  ADVIL,MOTRIN  Take 400 mg by mouth every 6 (six) hours as needed (for pain/fever).     methocarbamol 500 MG tablet  Commonly known as:  ROBAXIN  Take 1 tablet (500 mg total) by mouth every 6 (six) hours as needed for muscle spasms.     oxyCODONE-acetaminophen 7.5-325 MG per tablet  Commonly known as:  PERCOCET  Take 1-2 tablets by mouth every 4 (four) hours as needed for pain.        Diagnostic Studies: Dg Ankle Complete Right  12/17/2013   CLINICAL DATA:  ORIF right ankle  EXAM:  RIGHT ANKLE - COMPLETE 3+ VIEW  COMPARISON:  DG ANKLE COMPLETE*R* dated 12/13/2013  FINDINGS: Compression plate across distal tibial fracture with anatomic position and alignment. Mortise intact. Small bone fragments off the medial and posterior malleolus. Small plate across the medial malleolus.  IMPRESSION: ORIF with anatomic position and alignment   Electronically Signed   By: Skipper Cliche M.D.   On: 12/17/2013 14:34   Dg Ankle Complete Right  12/13/2013   CLINICAL DATA:  Post reduction, right ankle fracture-dislocation  EXAM: RIGHT ANKLE - COMPLETE 3+ VIEW  COMPARISON:  12/13/2013  FINDINGS: Cast artifact obscures detail. There is widening of the mortise medially with apparent relocation of the tibia with respect to the talar dome. Oblique distal fibular diaphyseal fracture reidentified with fracture fragments in near anatomic alignment. No radiopaque foreign body.  IMPRESSION: Postreduction right ankle fracture-dislocation as above.   Electronically Signed   By: Conchita Paris M.D.   On: 12/13/2013 07:51   Dg Ankle Complete Right  12/13/2013   CLINICAL DATA:  Right ankle pain and deformity following a fall.  EXAM: RIGHT ANKLE - COMPLETE 3+ VIEW  COMPARISON:  None.  FINDINGS: Anterior and medial dislocation of the distal tibia relative to the talus. Two small bone fragments between the two. There is also a fracture of the distal shaft of the fibula with 2/3 shaft width of lateral displacement and mild lateral angulation of the distal fragment. There is also pronounced posterior angulation of the distal fragment. Also noted is a small avulsion fracture off the distal aspect of the lateral malleolus.  IMPRESSION: Dislocation and fractures, as described above.   Electronically Signed   By: Enrique Sack M.D.   On: 12/13/2013 05:38   Dg C-arm 1-60 Min-no Report  12/17/2013   CLINICAL DATA:  orif  EXAM: DG C-ARM 1-60 MIN - NRPT [facility]  COMPARISON:  None.  FINDINGS: Patient status post open reduction internal  fixation distal fibula and medial malleolus. Hardware appears intact. There is near anatomic alignment of the ankle. The native osseous structures otherwise unremarkable.  IMPRESSION: Patient status post open reduction internal fixation of the distal fibula and ankle on the right.   Electronically Signed   By: Margaree Mackintosh M.D.   On: 12/17/2013 18:52    Disposition: 01-Home or Self Care      Discharge Instructions   Call MD / Call 911  Complete by:  As directed   If you experience chest pain or shortness of breath, CALL 911 and be transported to the hospital emergency room.  If you develope a fever above 101 F, pus (white drainage) or increased drainage or redness at the wound, or calf pain, call your surgeon's office.     Constipation Prevention    Complete by:  As directed   Drink plenty of fluids.  Prune juice may be helpful.  You may use a stool softener, such as Colace (over the counter) 100 mg twice a day.  Use MiraLax (over the counter) for constipation as needed.     Diet - low sodium heart healthy    Complete by:  As directed      Increase activity slowly as tolerated    Complete by:  As directed            Follow-up Information   Follow up with BEANE,JEFFREY C, MD In 2 weeks. (For suture removal)    Specialty:  Orthopedic Surgery   Contact information:   [address] [place] [address] [phone]      Remain NWB RLE Keep cast clean and dry Ice and elevate for swelling Follow up in 2 weeks  Signed: Conley Rolls. Golden Emile 12/18/2013, 11:35 AM

## 2014-02-04 ENCOUNTER — Ambulatory Visit (INDEPENDENT_AMBULATORY_CARE_PROVIDER_SITE_OTHER): Payer: 59 | Admitting: Surgery

## 2014-02-04 ENCOUNTER — Encounter (INDEPENDENT_AMBULATORY_CARE_PROVIDER_SITE_OTHER): Payer: Self-pay | Admitting: Surgery

## 2014-02-04 LAB — COMPREHENSIVE METABOLIC PANEL
ALBUMIN: 4.4 g/dL (ref 3.5–5.2)
ALT: 15 U/L (ref 0–35)
AST: 17 U/L (ref 0–37)
Alkaline Phosphatase: 55 U/L (ref 39–117)
BUN: 12 mg/dL (ref 6–23)
CALCIUM: 10 mg/dL (ref 8.4–10.5)
CHLORIDE: 102 meq/L (ref 96–112)
CO2: 24 mEq/L (ref 19–32)
Creat: 0.65 mg/dL (ref 0.50–1.10)
Glucose, Bld: 95 mg/dL (ref 70–99)
Potassium: 4.2 mEq/L (ref 3.5–5.3)
Sodium: 136 mEq/L (ref 135–145)
Total Bilirubin: 0.3 mg/dL (ref 0.2–1.2)
Total Protein: 7.7 g/dL (ref 6.0–8.3)

## 2014-02-04 LAB — T4: T4 TOTAL: 7.6 ug/dL (ref 5.0–12.5)

## 2014-02-04 LAB — HEMOGLOBIN A1C
HEMOGLOBIN A1C: 5.7 % — AB (ref <5.7)
Mean Plasma Glucose: 117 mg/dL — ABNORMAL HIGH (ref <117)

## 2014-02-04 LAB — TSH: TSH: 1.778 u[IU]/mL (ref 0.350–4.500)

## 2014-02-04 LAB — CBC
HEMATOCRIT: 37.3 % (ref 36.0–46.0)
HEMOGLOBIN: 12.2 g/dL (ref 12.0–15.0)
MCH: 23.3 pg — ABNORMAL LOW (ref 26.0–34.0)
MCHC: 32.7 g/dL (ref 30.0–36.0)
MCV: 71.3 fL — ABNORMAL LOW (ref 78.0–100.0)
Platelets: 408 10*3/uL — ABNORMAL HIGH (ref 150–400)
RBC: 5.23 MIL/uL — AB (ref 3.87–5.11)
RDW: 15.5 % (ref 11.5–15.5)
WBC: 8.1 10*3/uL (ref 4.0–10.5)

## 2014-02-04 LAB — IRON AND TIBC
%SAT: 21 % (ref 20–55)
IRON: 86 ug/dL (ref 42–145)
TIBC: 418 ug/dL (ref 250–470)
UIBC: 332 ug/dL (ref 125–400)

## 2014-02-04 LAB — LIPID PANEL
Cholesterol: 192 mg/dL (ref 0–200)
HDL: 54 mg/dL (ref >39)
LDL CALC: 90 mg/dL (ref 0–99)
TRIGLYCERIDES: 240 mg/dL — AB (ref <150)
Total CHOL/HDL Ratio: 3.6 Ratio
VLDL: 48 mg/dL — AB (ref 0–40)

## 2014-02-04 LAB — VITAMIN B12: Vitamin B-12: 451 pg/mL (ref 211–911)

## 2014-02-04 NOTE — Patient Instructions (Signed)
Laparoscopic Gastric Band Surgery Care After These instructions give you information on caring for yourself after your procedure. Your doctor may also give you more specific instructions. Call your doctor if you have any problems or questions after your procedure. HOME CARE   Take walks throughout the day. Do not sit for longer than 1 hour while awake for 4 to 6 weeks.  You may shower 2 days after surgery. Pat the surgery cuts (incisions) dry. Do not rub the surgery cuts.  Do your coughing and deep breathing exercises.  Do not lift, push, or pull anything heavy until your doctor says it is okay.  Only take medicines as told by your doctor. Do not drive while taking pain medicine.  Drink plenty of fluids to keep your pee (urine) clear or pale yellow.  Stay on a liquid diet as long as your doctor tells you to.  Do not drink caffeine for 1 month.  Change bandages (dressings) as told by your doctor.  Check your surgery cuts for redness, pufffiness (swelling), abnormal coloring, fluid, or bleeding.  Follow your doctor's advice about vitamin and protein needs after surgery. GET HELP RIGHT AWAY IF:  You feel sick to your stomach (nauseous) and throw up (vomit).  You have pain and discomfort with swallowing.  You develop shortness of breath or difficulty breathing.  You have pain, puffiness, or feel warmth on your lower body.  You have very bad calf pain or pain not relieved by medicine.  You have a temperature by mouth above 102 F (38.9 C).  Your surgery cuts look red, puffy, or they leak fluid.  Your poop (stool) is black, tarry, or dark red.  You have chills.  You have chest pain.  You feel confused.  You have slurred speech.  You feel lightheaded when standing.  You suddenly feel weak.  You have any questions or concerns. MAKE SURE YOU:   Understand these instructions.  Will watch your condition.  Will get help right away if you are not doing well or get  worse. Document Released: 08/18/2010 Document Revised: 11/10/2012 Document Reviewed: 08/18/2010 [facility] Patient Information 2015 [place], [place]. This information is not intended to replace advice given to you by your health care provider. Make sure you discuss any questions you have with your health care provider. Two weeks prior to surgery  Go on the extremely low carb liquid diet One week prior to surgery  No aspirin products.  Tylenol is acceptable  Stop smoking 24 hours prior to surgery  No alcoholic beverages  Report fever greater than 100.5 or excessive nasal drainage suggesting infection  Continue bariatric preop diet  Perform bowel prep if ordered  Do not eat or drink anything after midnight the night before surgery  Do not take any medications except those instructed by the anesthesiologist Morning of surgery  Please arrive at the hospital at least 2 hours before your scheduled surgery time.  No makeup, fingernail polish or jewelry  Bring insurance cards with you  Bring your CPAP mask if you use this

## 2014-02-04 NOTE — Progress Notes (Signed)
Chief Complaint:  Morbid obesity BMI 42  History of Present Illness:  Sandy [name] is an 38 y.o. female lady referred by Dr. Erasmo Score to consider bariatric surgery.  She is followed by Dr. Garwin Brothers as well.  She has had a lifelong issues with obesity but has been very successful with weight loss plans losing up to 75 pounds only to regain the weight. She has decided she would do best with a laparoscopic adjustable gastric band. I think with her history of weight loss success that that would be a very good tool for her.  I went ahead and by way of review went over all 3 procedures in some detail including risks benefits et Ronney Asters. She would like to proceed with workup for laparoscopic adjustable gastric band.  Past Medical History  Diagnosis Date  . Numbness     OCASIONAL NUMBNESS RT LEG  . Fracture of ankle     RT    Past Surgical History  Procedure Laterality Date  . Cholecystectomy    . Orif ankle fracture Right 12/17/2013    Procedure: OPEN REDUCTION INTERNAL FIXATION (ORIF) RIGHT  ANKLE FRACTURE WITH SYNDESMOSIS SENSATION;  Surgeon: Johnn Hai, MD;  Location: [facility];  Service: Orthopedics;  Laterality: Right;    Current Outpatient Prescriptions  Medication Sig Dispense Refill  . docusate sodium (COLACE) 100 MG capsule Take 1 capsule (100 mg total) by mouth 2 (two) times daily.  30 capsule  1  . ibuprofen (ADVIL,MOTRIN) 200 MG tablet Take 400 mg by mouth every 6 (six) hours as needed (for pain/fever).      . methocarbamol (ROBAXIN) 500 MG tablet Take 1 tablet (500 mg total) by mouth every 6 (six) hours as needed for muscle spasms.  40 tablet  1  . oxyCODONE-acetaminophen (PERCOCET) 7.5-325 MG per tablet Take 1-2 tablets by mouth every 4 (four) hours as needed for pain.  50 tablet  0   No current facility-administered medications for this visit.   Review of patient's allergies indicates no known allergies. No family history on file. Social History:   reports that she has  quit smoking. She quit smokeless tobacco use about 7 years ago. She reports that she drinks alcohol. She reports that she does not use illicit drugs.   REVIEW OF SYSTEMS : Positive for for her cholecystectomy ; otherwise negative  Physical Exam:   Blood pressure 116/76, pulse 80, temperature 97.8 F (36.6 C), height 5\' 1"  (1.549 m), weight 225 lb (102.059 kg). Body mass index is 42.54 kg/(m^2).  Gen:  WDWN African American female NAD  Neurological: Alert and oriented to person, place, and time. Motor and sensory function is grossly intact  Head: Normocephalic and atraumatic.  Eyes: Conjunctivae are normal. Pupils are equal, round, and reactive to light. No scleral icterus.  Neck: Normal range of motion. Neck supple. No tracheal deviation or thyromegaly present.  Cardiovascular:  SR without murmurs or gallops.  No carotid bruits Breast:  Not examined Respiratory: Effort normal.  No respiratory distress. No chest wall tenderness. Breath sounds normal.  No wheezes, rales or rhonchi.  Abdomen:  Moderately obese GU:  Not examined Musculoskeletal: Normal range of motion. Extremities are nontender. No cyanosis, edema or clubbing noted  Walking boot is on the right leg for the underlying ankle fracture. Lymphadenopathy: No cervical, preauricular, postauricular or axillary adenopathy is present Skin: Skin is warm and dry. No rash noted. No diaphoresis. No erythema. No pallor. Pscyh: Normal mood and affect. Behavior is normal. Judgment  and thought content normal.   LABORATORY RESULTS: No results found for this or any previous visit (from the past 48 hour(s)).   RADIOLOGY RESULTS: No results found.  Problem List: Patient Active Problem List   Diagnosis Date Noted  . Ankle fracture, right 12/17/2013  . Closed right ankle fracture 12/17/2013    Assessment & Plan: Morbid obesity BMI 42 Plan workup for Lapband placement.     Matt B. Hassell Done, MD, [facility] [phone] beeper [phone]  02/04/2014 11:38 AM

## 2014-02-05 LAB — PREGNANCY, URINE: Preg Test, Ur: NEGATIVE

## 2014-02-05 LAB — VITAMIN D 25 HYDROXY (VIT D DEFICIENCY, FRACTURES): Vit D, 25-Hydroxy: 18 ng/mL — ABNORMAL LOW (ref 30–89)

## 2014-02-26 ENCOUNTER — Other Ambulatory Visit: Payer: Self-pay | Admitting: Obstetrics and Gynecology

## 2014-03-01 ENCOUNTER — Encounter (HOSPITAL_COMMUNITY): Payer: Self-pay | Admitting: Pharmacist

## 2014-03-01 ENCOUNTER — Other Ambulatory Visit (HOSPITAL_COMMUNITY): Payer: Managed Care, Other (non HMO)

## 2014-03-01 ENCOUNTER — Encounter (HOSPITAL_COMMUNITY): Payer: Self-pay | Admitting: *Deleted

## 2014-03-03 ENCOUNTER — Encounter (HOSPITAL_COMMUNITY): Payer: Self-pay | Admitting: *Deleted

## 2014-03-03 ENCOUNTER — Encounter (HOSPITAL_COMMUNITY)
Admission: RE | Disposition: A | Discharge status: Home / Self Care | Payer: Self-pay | Source: Ambulatory Visit | Attending: Obstetrics and Gynecology

## 2014-03-03 ENCOUNTER — Ambulatory Visit (HOSPITAL_COMMUNITY): Payer: Managed Care, Other (non HMO) | Admitting: Anesthesiology

## 2014-03-03 ENCOUNTER — Ambulatory Visit (HOSPITAL_COMMUNITY)
Admission: RE | Admit: 2014-03-03 | Discharge: 2014-03-03 | Disposition: A | Discharge status: Home / Self Care | Payer: Managed Care, Other (non HMO) | Source: Ambulatory Visit | Attending: Obstetrics and Gynecology | Admitting: Obstetrics and Gynecology

## 2014-03-03 ENCOUNTER — Encounter (HOSPITAL_COMMUNITY): Payer: Managed Care, Other (non HMO) | Admitting: Anesthesiology

## 2014-03-03 DIAGNOSIS — R209 Unspecified disturbances of skin sensation: Secondary | ICD-10-CM | POA: Insufficient documentation

## 2014-03-03 DIAGNOSIS — N938 Other specified abnormal uterine and vaginal bleeding: Secondary | ICD-10-CM | POA: Insufficient documentation

## 2014-03-03 DIAGNOSIS — N840 Polyp of corpus uteri: Secondary | ICD-10-CM | POA: Insufficient documentation

## 2014-03-03 DIAGNOSIS — N949 Unspecified condition associated with female genital organs and menstrual cycle: Secondary | ICD-10-CM | POA: Insufficient documentation

## 2014-03-03 HISTORY — PX: DILATATION & CURRETTAGE/HYSTEROSCOPY WITH RESECTOCOPE: SHX5572

## 2014-03-03 LAB — CBC
HEMATOCRIT: 39.7 % (ref 36.0–46.0)
HEMOGLOBIN: 12.7 g/dL (ref 12.0–15.0)
MCH: 23.5 pg — ABNORMAL LOW (ref 26.0–34.0)
MCHC: 32 g/dL (ref 30.0–36.0)
MCV: 73.4 fL — AB (ref 78.0–100.0)
Platelets: 379 10*3/uL (ref 150–400)
RBC: 5.41 MIL/uL — AB (ref 3.87–5.11)
RDW: 14.7 % (ref 11.5–15.5)
WBC: 6.2 10*3/uL (ref 4.0–10.5)

## 2014-03-03 LAB — PREGNANCY, URINE: Preg Test, Ur: NEGATIVE

## 2014-03-03 SURGERY — DILATATION & CURETTAGE/HYSTEROSCOPY WITH RESECTOCOPE
Anesthesia: General | Site: Vagina

## 2014-03-03 MED ORDER — OXYCODONE-ACETAMINOPHEN 5-325 MG PO TABS
ORAL_TABLET | ORAL | Status: AC
  Filled 2014-03-03: qty 2

## 2014-03-03 MED ORDER — DEXAMETHASONE SODIUM PHOSPHATE 10 MG/ML IJ SOLN
INTRAMUSCULAR | Status: DC | PRN
  Administered 2014-03-03: 10 mg via INTRAVENOUS

## 2014-03-03 MED ORDER — SCOPOLAMINE 1 MG/3DAYS TD PT72
MEDICATED_PATCH | TRANSDERMAL | Status: AC
  Administered 2014-03-03: 1.5 mg via TRANSDERMAL
  Filled 2014-03-03: qty 1

## 2014-03-03 MED ORDER — FENTANYL CITRATE 0.05 MG/ML IJ SOLN
INTRAMUSCULAR | Status: AC
  Administered 2014-03-03: 50 ug via INTRAVENOUS
  Filled 2014-03-03: qty 2

## 2014-03-03 MED ORDER — SCOPOLAMINE 1 MG/3DAYS TD PT72
1.0000 | MEDICATED_PATCH | Freq: Once | TRANSDERMAL | Status: DC
  Administered 2014-03-03: 1.5 mg via TRANSDERMAL

## 2014-03-03 MED ORDER — KETOROLAC TROMETHAMINE 30 MG/ML IJ SOLN: 15.0000 mg | Freq: Once | INTRAMUSCULAR | Status: DC | PRN

## 2014-03-03 MED ORDER — FENTANYL CITRATE 0.05 MG/ML IJ SOLN
INTRAMUSCULAR | Status: AC
  Filled 2014-03-03: qty 2

## 2014-03-03 MED ORDER — DEXAMETHASONE SODIUM PHOSPHATE 10 MG/ML IJ SOLN
INTRAMUSCULAR | Status: AC
  Filled 2014-03-03: qty 1

## 2014-03-03 MED ORDER — MIDAZOLAM HCL 2 MG/2ML IJ SOLN: 0.5000 mg | Freq: Once | INTRAMUSCULAR | Status: DC | PRN

## 2014-03-03 MED ORDER — KETOROLAC TROMETHAMINE 60 MG/2ML IM SOLN
INTRAMUSCULAR | Status: DC | PRN
  Administered 2014-03-03: 30 mg via INTRAMUSCULAR

## 2014-03-03 MED ORDER — ONDANSETRON HCL 4 MG/2ML IJ SOLN
INTRAMUSCULAR | Status: AC
  Filled 2014-03-03: qty 2

## 2014-03-03 MED ORDER — PROPOFOL 10 MG/ML IV EMUL
INTRAVENOUS | Status: AC
  Filled 2014-03-03: qty 20

## 2014-03-03 MED ORDER — LIDOCAINE HCL (CARDIAC) 20 MG/ML IV SOLN
INTRAVENOUS | Status: AC
  Filled 2014-03-03: qty 5

## 2014-03-03 MED ORDER — MIDAZOLAM HCL 2 MG/2ML IJ SOLN
INTRAMUSCULAR | Status: AC
  Filled 2014-03-03: qty 2

## 2014-03-03 MED ORDER — LACTATED RINGERS IV SOLN
INTRAVENOUS | Status: DC
  Administered 2014-03-03 (×2): via INTRAVENOUS

## 2014-03-03 MED ORDER — ONDANSETRON HCL 4 MG/2ML IJ SOLN
INTRAMUSCULAR | Status: DC | PRN
  Administered 2014-03-03: 4 mg via INTRAVENOUS

## 2014-03-03 MED ORDER — LIDOCAINE HCL (CARDIAC) 20 MG/ML IV SOLN
INTRAVENOUS | Status: DC | PRN
  Administered 2014-03-03: 50 mg via INTRAVENOUS

## 2014-03-03 MED ORDER — PROPOFOL 10 MG/ML IV BOLUS
INTRAVENOUS | Status: DC | PRN
  Administered 2014-03-03: 200 mg via INTRAVENOUS

## 2014-03-03 MED ORDER — FENTANYL CITRATE 0.05 MG/ML IJ SOLN
25.0000 ug | INTRAMUSCULAR | Status: DC | PRN
  Administered 2014-03-03 (×2): 50 ug via INTRAVENOUS

## 2014-03-03 MED ORDER — KETOROLAC TROMETHAMINE 30 MG/ML IJ SOLN
INTRAMUSCULAR | Status: AC
  Filled 2014-03-03: qty 2

## 2014-03-03 MED ORDER — MEPERIDINE HCL 25 MG/ML IJ SOLN: 6.2500 mg | INTRAMUSCULAR | Status: DC | PRN

## 2014-03-03 MED ORDER — OXYCODONE-ACETAMINOPHEN 5-325 MG PO TABS
2.0000 | ORAL_TABLET | Freq: Once | ORAL | Status: AC
  Administered 2014-03-03: 2 via ORAL

## 2014-03-03 MED ORDER — ACETAMINOPHEN 160 MG/5ML PO SOLN
ORAL | Status: AC
  Filled 2014-03-03: qty 20.3

## 2014-03-03 MED ORDER — MIDAZOLAM HCL 5 MG/5ML IJ SOLN
INTRAMUSCULAR | Status: DC | PRN
  Administered 2014-03-03: 2 mg via INTRAVENOUS

## 2014-03-03 MED ORDER — KETOROLAC TROMETHAMINE 30 MG/ML IJ SOLN
INTRAMUSCULAR | Status: DC | PRN
  Administered 2014-03-03: 30 mg via INTRAVENOUS

## 2014-03-03 MED ORDER — PROMETHAZINE HCL 25 MG/ML IJ SOLN: 6.2500 mg | INTRAMUSCULAR | Status: DC | PRN

## 2014-03-03 MED ORDER — FENTANYL CITRATE 0.05 MG/ML IJ SOLN
INTRAMUSCULAR | Status: DC | PRN
  Administered 2014-03-03: 100 ug via INTRAVENOUS
  Administered 2014-03-03: 25 ug via INTRAVENOUS
  Administered 2014-03-03: 50 ug via INTRAVENOUS
  Administered 2014-03-03: 25 ug via INTRAVENOUS

## 2014-03-03 MED ORDER — ACETAMINOPHEN 160 MG/5ML PO SOLN
650.0000 mg | Freq: Once | ORAL | Status: AC
  Administered 2014-03-03: 650 mg via ORAL

## 2014-03-03 MED ORDER — IBUPROFEN 800 MG PO TABS: 800.0000 mg | ORAL_TABLET | Freq: Three times a day (TID) | ORAL | Status: DC | PRN

## 2014-03-03 SURGICAL SUPPLY — 20 items
CANISTER SUCT 3000ML (MISCELLANEOUS) ×3 IMPLANT
CATH ROBINSON RED A/P 16FR (CATHETERS) ×3 IMPLANT
CLOTH BEACON ORANGE TIMEOUT ST (SAFETY) ×3 IMPLANT
CONTAINER PREFILL 10% NBF 60ML (FORM) ×4 IMPLANT
DRAPE HYSTEROSCOPY (DRAPE) ×3 IMPLANT
DRSG TELFA 3X8 NADH (GAUZE/BANDAGES/DRESSINGS) ×3 IMPLANT
ELECT REM PT RETURN 9FT ADLT (ELECTROSURGICAL) ×3
ELECTRODE REM PT RTRN 9FT ADLT (ELECTROSURGICAL) ×1 IMPLANT
GLOVE BIOGEL PI IND STRL 7.0 (GLOVE) ×2 IMPLANT
GLOVE BIOGEL PI INDICATOR 7.0 (GLOVE) ×4
GLOVE ECLIPSE 6.5 STRL STRAW (GLOVE) ×3 IMPLANT
GOWN STRL REUS W/TWL LRG LVL3 (GOWN DISPOSABLE) ×6 IMPLANT
LOOP ANGLED CUTTING 22FR (CUTTING LOOP) ×2 IMPLANT
PACK VAGINAL MINOR WOMEN LF (CUSTOM PROCEDURE TRAY) ×3 IMPLANT
PAD DRESSING TELFA 3X8 NADH (GAUZE/BANDAGES/DRESSINGS) ×1 IMPLANT
PAD OB MATERNITY 4.3X12.25 (PERSONAL CARE ITEMS) ×3 IMPLANT
SET TUBING HYSTEROSCOPY 2 NDL (TUBING) ×2 IMPLANT
TOWEL OR 17X24 6PK STRL BLUE (TOWEL DISPOSABLE) ×6 IMPLANT
TUBE HYSTEROSCOPY W Y-CONNECT (TUBING) ×2 IMPLANT
WATER STERILE IRR 1000ML POUR (IV SOLUTION) ×3 IMPLANT

## 2014-03-03 NOTE — Brief Op Note (Signed)
03/03/2014  3:30 PM  PATIENT:  Sandy [name]  38 y.o. female  PRE-OPERATIVE DIAGNOSIS:  Endometrial Polyps, DUB  POST-OPERATIVE DIAGNOSIS:  Endometrial Polyps, DUB  PROCEDURE:  Diagnostic hysteroscopy, hysteroscopic resection of endometrial polyps, dilation and curettage  SURGEON:  Surgeon(s) and Role:    * Raheen Capili Clint Bolder, MD - Primary  PHYSICIAN ASSISTANT:   ASSISTANTS: none   ANESTHESIA:   general  EBL:  Total I/O In: 1200 [I.V.:1200] Out: 60 [Urine:50; Blood:10]  BLOOD ADMINISTERED:none  DRAINS: none   LOCAL MEDICATIONS USED:  NONE  SPECIMEN:  Source of Specimen:  EMC w/ polyps  DISPOSITION OF SPECIMEN:  PATHOLOGY  COUNTS:  YES  TOURNIQUET:  * No tourniquets in log *  DICTATION: .Other Dictation: Dictation Number [id number]  PLAN OF CARE: Discharge to home after PACU  PATIENT DISPOSITION:  PACU - hemodynamically stable.   Delay start of Pharmacological VTE agent (>24hrs) due to surgical blood loss or risk of bleeding: no

## 2014-03-03 NOTE — Op Note (Signed)
Sandy [name], LEIST NO.:  [id number]  MEDICAL RECORD NO.:  [id number]  LOCATION:  [facility]                          FACILITY:  [facility]  PHYSICIAN:  [name], M.D.DATE OF BIRTH:  05/05/1976  DATE OF PROCEDURE:  03/03/2014 DATE OF DISCHARGE:  03/03/2014                              OPERATIVE REPORT   PREOPERATIVE DIAGNOSES:  Dysfunctional uterine bleeding, endometrial masses.  PROCEDURE:  Diagnostic hysteroscopy, hysteroscopic resection of endometrial polyps, dilation and curettage.  POSTOPERATIVE DIAGNOSES:  Dysfunctional uterine bleeding, endometrial polyps.  ANESTHESIA:  General.  SURGEON:  [name], MD  ASSISTANT:  None.  DESCRIPTION OF PROCEDURE:  Under adequate general anesthesia, the patient was placed in a dorsal lithotomy position.  She was sterilely prepped and draped in usual fashion.  The bladder was catheterized for small amount of urine.  Examination under anesthesia revealed anteverted irregular uterus slightly enlarged and no adnexal masses could be appreciated but limited by the patient's body habitus.  Bivalve speculum was placed in vagina.  A single-tooth tenaculum was placed on the anterior lip of the cervix.  Retroverted uterus was noted.  The cervix was serially dilated up to a #29 Pratt dilator.  A primed diagnostic hysteroscope with a single loop was inserted into the uterine cavity.  Multiple polypoid lesions were noted.  The cavity was gently resected using the loop with heat. The resectoscope was removed.  The cavity was then curettaged for moderate amount of tissue.  The resectoscope was reinserted.  The cavity was inspected.  Both tubal ostia could be seen.  There were no other lesions noted at which time all instruments from the vagina was removed.  SPECIMEN LABELED:  Endometrial curetting with polyps sent to Pathology.  ESTIMATED BLOOD LOSS:  Minimal.  FLUID DEFICIT:  50 mL.  COMPLICATION:  None.  The  patient tolerated the procedure well and was transferred to recovery room in stable condition.     [name], M.D.     [place]/MEDQ  D:  03/03/2014  T:  03/03/2014  Job:  [id number]

## 2014-03-03 NOTE — Anesthesia Postprocedure Evaluation (Signed)
  Anesthesia Post Note  Patient: Sandy [name]  Procedure(s) Performed: Procedure(s) (LRB): DILATATION & CURETTAGE/HYSTEROSCOPY WITH RESECTOSCOPE (N/A)  Anesthesia type: GA  Patient location: PACU  Post pain: Pain level controlled  Post assessment: Post-op Vital signs reviewed  Last Vitals:  Filed Vitals:   03/03/14 1327  BP: 135/79  Pulse: 80  Temp: 37.1 C  Resp: 18    Post vital signs: Reviewed  Level of consciousness: sedated  Complications: No apparent anesthesia complications

## 2014-03-03 NOTE — Discharge Instructions (Signed)
CALL  IF TEMP>100.4, NOTHING PER VAGINA X 1 WK, CALL IF SOAKING A MAXI  PAD EVERY HOUR OR MORE FREQUENTLY   DISCHARGE INSTRUCTIONS: HYSTEROSCOPY / ENDOMETRIAL ABLATION The following instructions have been prepared to help you care for yourself upon your return home.  Personal hygiene:  Use sanitary pads for vaginal drainage, not tampons.  Shower the day after your procedure.  NO tub baths, pools or Jacuzzis for 2-3 weeks.  Wipe front to back after using the bathroom.  Activity and limitations:  Do NOT drive or operate any equipment for 24 hours. The effects of anesthesia are still present and drowsiness may result.  Do NOT rest in bed all day.  Walking is encouraged.  Walk up and down stairs slowly.  You may resume your normal activity in one to two days or as indicated by your physician. Sexual activity: NO intercourse for at least 2 weeks after the procedure, or as indicated by your Doctor.  Diet: Eat a light meal as desired this evening. You may resume your usual diet tomorrow.  Return to Work: You may resume your work activities in one to two days or as indicated by Marine scientist.  What to expect after your surgery: Expect to have vaginal bleeding/discharge for 2-3 days and spotting for up to 10 days. It is not unusual to have soreness for up to 1-2 weeks. You may have a slight burning sensation when you urinate for the first day. Mild cramps may continue for a couple of days. You may have a regular period in 2-6 weeks.  NO IBUPROFEN PRODUCTS (MOTRIN, ADVIL) OR ALEVE UNTIL 8:50PM TODAY.    Call your doctor for any of the following:  Excessive vaginal bleeding or clotting, saturating and changing one pad every hour.  Inability to urinate 6 hours after discharge from hospital.  Pain not relieved by pain medication.  Fever of 100.4 F or greater.  Unusual vaginal discharge or odor.  Return to office _________________Call for an appointment  ___________________ Patients signature: ______________________ Nurses signature ________________________  [place] [facility] [phone]

## 2014-03-03 NOTE — Transfer of Care (Signed)
Immediate Anesthesia Transfer of Care Note  Patient: Sandy [name]  Procedure(s) Performed: Procedure(s): DILATATION & CURETTAGE/HYSTEROSCOPY WITH RESECTOSCOPE (N/A)  Patient Location: PACU  Anesthesia Type:General  Level of Consciousness: sedated  Airway & Oxygen Therapy: Patient Spontanous Breathing and Patient connected to nasal cannula oxygen  Post-op Assessment: Report given to PACU RN  Post vital signs: Reviewed and stable  Complications: No apparent anesthesia complications

## 2014-03-03 NOTE — Anesthesia Preprocedure Evaluation (Signed)
Anesthesia Evaluation  Patient identified by MRN, date of birth, ID band Patient awake    Reviewed: Allergy & Precautions, H&P , Patient's Chart, lab work & pertinent test results, reviewed documented beta blocker date and time   History of Anesthesia Complications Negative for: history of anesthetic complications  Airway Mallampati: III TM Distance: >3 FB Neck ROM: full    Dental   Pulmonary  breath sounds clear to auscultation        Cardiovascular Exercise Tolerance: Good Rhythm:regular Rate:Normal     Neuro/Psych negative psych ROS   GI/Hepatic   Endo/Other  Morbid obesity  Renal/GU      Musculoskeletal   Abdominal   Peds  Hematology   Anesthesia Other Findings   Reproductive/Obstetrics                           Anesthesia Physical Anesthesia Plan  ASA: III  Anesthesia Plan: General LMA   Post-op Pain Management:    Induction:   Airway Management Planned:   Additional Equipment:   Intra-op Plan:   Post-operative Plan:   Informed Consent: I have reviewed the patients History and Physical, chart, labs and discussed the procedure including the risks, benefits and alternatives for the proposed anesthesia with the patient or authorized representative who has indicated his/her understanding and acceptance.   Dental Advisory Given  Plan Discussed with: CRNA, Surgeon and Anesthesiologist  Anesthesia Plan Comments:         Anesthesia Quick Evaluation

## 2014-03-03 NOTE — H&P (Signed)
Sandy [name] is an 38 y.o. female.[id number] BF presents for dx hysteroscopy, D&C, removal of multiple endometrial masses due to DUB and sono hysterogram findings c/w possible endometrial polyps (+) uterine fibroids on sonogram  Pertinent Gynecological History: Menses: irregular occurring approximately every 21 days with spotting approximately >7 days per month Bleeding: dysfunctional uterine bleeding Contraception: condoms DES exposure: denies Blood transfusions: none Sexually transmitted diseases: no past history Previous GYN Procedures: DNC and hysteroscopy  Last mammogram: normal Date: n/a Last pap: normal Date: 05/06/2012 HPV neg OB History: G2, P0   Menstrual History Patient's last menstrual period was 02/25/2014.    Past Medical History  Diagnosis Date  . Numbness     OCASIONAL NUMBNESS RT LEG  . Fracture of ankle     RT    Past Surgical History  Procedure Laterality Date  . Cholecystectomy    . Orif ankle fracture Right 12/17/2013    Procedure: OPEN REDUCTION INTERNAL FIXATION (ORIF) RIGHT  ANKLE FRACTURE WITH SYNDESMOSIS SENSATION;  Surgeon: Johnn Hai, MD;  Location: [facility];  Service: Orthopedics;  Laterality: Right;  . Breast surgery      right benign tumor  . Fracture surgery      tiblia    History reviewed. No pertinent family history.  Social History:  reports that she has never smoked. She has never used smokeless tobacco. She reports that she drinks alcohol. She reports that she does not use illicit drugs.  Allergies: No Known Allergies  Prescriptions prior to admission  Medication Sig Dispense Refill  . docusate sodium (COLACE) 100 MG capsule Take 1 capsule (100 mg total) by mouth 2 (two) times daily.  30 capsule  1  . oxyCODONE-acetaminophen (PERCOCET) 7.5-325 MG per tablet Take 1-2 tablets by mouth every 4 (four) hours as needed for pain.  50 tablet  0    Review of Systems  All other systems reviewed and are negative.   Blood pressure  135/79, pulse 80, temperature 98.8 F (37.1 C), temperature source Oral, resp. rate 18, last menstrual period 02/25/2014, SpO2 98.00%. Physical Exam  Constitutional: She is oriented to person, place, and time. She appears well-developed and well-nourished.  Neck: Neck supple.  Cardiovascular: Regular rhythm.   Respiratory: Breath sounds normal.  GI: Soft.  Neurological: She is alert and oriented to person, place, and time.  Skin: Skin is warm and dry.  Psychiatric: She has a normal mood and affect.    No results found for this or any previous visit (from the past 24 hour(s)).  No results found.  Assessment/Plan: DUB Endometrial mass P) dx hysteroscopy, D&C. Procedure explained. Risk of surgery includes infection, bleeding, injury to surrounding organ structures, thermal injury, uterine perforation ( 07/998) and its risk, fluid overload. All ? answered  Sandy [name] A 03/03/2014, 1:34 PM

## 2014-03-04 ENCOUNTER — Encounter (HOSPITAL_COMMUNITY): Payer: Self-pay | Admitting: Obstetrics and Gynecology

## 2014-03-15 ENCOUNTER — Encounter (HOSPITAL_COMMUNITY)
Admission: RE | Disposition: A | Discharge status: Home / Self Care | Payer: Self-pay | Source: Ambulatory Visit | Attending: Gastroenterology

## 2014-03-15 ENCOUNTER — Ambulatory Visit (HOSPITAL_COMMUNITY)
Admission: RE | Admit: 2014-03-15 | Discharge: 2014-03-15 | Disposition: A | Discharge status: Home / Self Care | Payer: Managed Care, Other (non HMO) | Source: Ambulatory Visit | Attending: Surgery | Admitting: Surgery

## 2014-03-15 ENCOUNTER — Ambulatory Visit (HOSPITAL_COMMUNITY)
Admission: RE | Admit: 2014-03-15 | Discharge: 2014-03-15 | Disposition: A | Discharge status: Home / Self Care | Payer: Managed Care, Other (non HMO) | Source: Ambulatory Visit | Attending: Gastroenterology | Admitting: Gastroenterology

## 2014-03-15 ENCOUNTER — Telehealth (INDEPENDENT_AMBULATORY_CARE_PROVIDER_SITE_OTHER): Payer: Self-pay

## 2014-03-15 DIAGNOSIS — Z6841 Body Mass Index (BMI) 40.0 and over, adult: Secondary | ICD-10-CM | POA: Insufficient documentation

## 2014-03-15 DIAGNOSIS — Z01818 Encounter for other preprocedural examination: Secondary | ICD-10-CM | POA: Diagnosis not present

## 2014-03-15 DIAGNOSIS — Z13820 Encounter for screening for osteoporosis: Secondary | ICD-10-CM | POA: Diagnosis not present

## 2014-03-15 DIAGNOSIS — Z8781 Personal history of (healed) traumatic fracture: Secondary | ICD-10-CM | POA: Diagnosis not present

## 2014-03-15 DIAGNOSIS — Z87891 Personal history of nicotine dependence: Secondary | ICD-10-CM | POA: Insufficient documentation

## 2014-03-15 HISTORY — PX: BREATH TEK H PYLORI: SHX5422

## 2014-03-15 SURGERY — BREATH TEST, FOR HELICOBACTER PYLORI

## 2014-03-15 NOTE — Telephone Encounter (Signed)
Radiology called to let Dr Hassell Done know pts UGI is ready for review.

## 2014-03-15 NOTE — Progress Notes (Signed)
03/15/14 0830  BREATH TEK ASSESSMENT  Referring MD Earle Gell  Time of Last PO Intake 2200  Baseline Breath At: 0745  Pranactin Given At: 0748  Post-Dose Breath At: 0802  Sample 1 2.3  Sample 2 3.1  Test Negative

## 2014-03-15 NOTE — Telephone Encounter (Signed)
Called and spoke to patient regarding her Pre-Op Bariatric Surgery UGI results.  Patient aware that there was an abnormality found and Radiologist recommends an Upper Endoscopy for further evaluation.  Patient aware that our office will be calling to have her scheduled for an EGD with Dr. Lucia Gaskins.  Patient advised to call our office if she has any further questions or concerns.  Patient verbalized understanding.

## 2014-03-16 ENCOUNTER — Ambulatory Visit: Payer: Managed Care, Other (non HMO) | Admitting: Dietician

## 2014-03-16 ENCOUNTER — Encounter (HOSPITAL_COMMUNITY): Payer: Self-pay | Admitting: Surgery

## 2014-03-16 NOTE — Telephone Encounter (Signed)
Reviewed UGI with Dr. Hassell Done on 03/15/14.  Patient needs EGD for further evaluation of abnormal UGI.  Called and reviewed UGI results with patient.  Written orders for EGD placed in [facility] on 03/15/14 marked as Urgent for scheduling.

## 2014-03-17 ENCOUNTER — Telehealth (INDEPENDENT_AMBULATORY_CARE_PROVIDER_SITE_OTHER): Payer: Self-pay

## 2014-03-17 NOTE — Telephone Encounter (Signed)
Called and left message for patient.  Patient had questions regarding her recent UGI results.  Patient has appointment on 03/18/14 with Dr. Hassell Done to discuss results and EGD that's scheduled for Monday 03/22/14.

## 2014-03-18 ENCOUNTER — Encounter: Payer: Self-pay | Admitting: Dietician

## 2014-03-18 ENCOUNTER — Encounter: Payer: Managed Care, Other (non HMO) | Attending: Surgery | Admitting: Dietician

## 2014-03-18 ENCOUNTER — Telehealth: Payer: Self-pay | Admitting: Gastroenterology

## 2014-03-18 ENCOUNTER — Encounter: Payer: Self-pay | Admitting: Gastroenterology

## 2014-03-18 ENCOUNTER — Ambulatory Visit (INDEPENDENT_AMBULATORY_CARE_PROVIDER_SITE_OTHER): Payer: 59 | Admitting: Surgery

## 2014-03-18 VITALS — BP 130/62 | HR 76 | Temp 98.7°F | Ht 61.0 in | Wt 230.5 lb

## 2014-03-18 DIAGNOSIS — Z713 Dietary counseling and surveillance: Secondary | ICD-10-CM | POA: Diagnosis present

## 2014-03-18 DIAGNOSIS — Z6841 Body Mass Index (BMI) 40.0 and over, adult: Secondary | ICD-10-CM | POA: Diagnosis not present

## 2014-03-18 DIAGNOSIS — K229 Disease of esophagus, unspecified: Secondary | ICD-10-CM

## 2014-03-18 DIAGNOSIS — K2289 Other specified disease of esophagus: Secondary | ICD-10-CM

## 2014-03-18 DIAGNOSIS — K228 Other specified diseases of esophagus: Secondary | ICD-10-CM

## 2014-03-18 NOTE — Progress Notes (Signed)
Sandy [name] 38 y.o.  Body mass index is 43.58 kg/(m^2).  Patient Active Problem List   Diagnosis Date Noted  . Morbid obesity 02/04/2014  . Ankle fracture, right 12/17/2013  . Closed right ankle fracture 12/17/2013    No Known Allergies    Past Surgical History  Procedure Laterality Date  . Cholecystectomy    . Orif ankle fracture Right 12/17/2013    Procedure: OPEN REDUCTION INTERNAL FIXATION (ORIF) RIGHT  ANKLE FRACTURE WITH SYNDESMOSIS SENSATION;  Surgeon: Johnn Hai, MD;  Location: [facility];  Service: Orthopedics;  Laterality: Right;  . Breast surgery      right benign tumor  . Fracture surgery      tiblia  . Dilatation & currettage/hysteroscopy with resectocope N/A 03/03/2014    Procedure: DILATATION & CURETTAGE/HYSTEROSCOPY WITH RESECTOSCOPE;  Surgeon: Marvene Staff, MD;  Location: [facility];  Service: Gynecology;  Laterality: N/A;  . Breath tek h pylori N/A 03/15/2014    Procedure: BREATH TEK H PYLORI;  Surgeon: Pedro Earls, MD;  [facility];  Service: Endoscopy;  Laterality: N/A;   [name],[name], MD No diagnosis found.  UGI reviewed with the patient and she was able to meet Dr. Lucia Gaskins.  Will schedule upper endoscopy with Matei Magnone/Newman the following week when both of [place] are here.   She is heading to her appt with nutrition.   Matt B. Hassell Done, MD, [facility] [phone] beeper [phone]  03/18/2014 11:06 AM

## 2014-03-18 NOTE — Patient Instructions (Signed)

## 2014-03-18 NOTE — Telephone Encounter (Signed)
Patient states Dr. Hassell Done ordered UGI on her. The UGI showed something "mass like". She is off work next week but Dr. Hassell Done cannot do an EGD then. She is asking if Dr. Fuller Plan would do and EGD next week.(No appointment available that I see) Please, advise.

## 2014-03-18 NOTE — Progress Notes (Signed)
  Pre-Op Assessment Visit:  Pre-Operative LAGB Surgery  Medical Nutrition Therapy:  Appt start time: 5681   End time:  1200.  Patient was seen on 03/18/2014 for Pre-Operative LAGB Nutrition Assessment. Assessment and letter of approval faxed to [facility] Bariatric Surgery Program coordinator on 03/18/14.  Preferred Learning Style:   No preference indicated   Learning Readiness:   Ready  Handouts given during visit include:  Pre-Op Goals Bariatric Surgery Protein Shakes  Teaching Method Utilized:  Visual Auditory  Barriers to learning/adherence to lifestyle change: none  Demonstrated degree of understanding via:  Teach Back   Patient to call the [facility] to enroll in Pre-Op and Post-Op Nutrition Education when surgery date is scheduled.

## 2014-03-18 NOTE — Telephone Encounter (Signed)
Spoke with patient and gave her recommendations of OV prior to scheduling EGD since she may need EGD/EUS. Patient states she has already worked it out with Dr. Hassell Done and does not want to schedule here now.

## 2014-03-18 NOTE — Telephone Encounter (Signed)
I prefer not to perform a direct EGD unless this is doctor referred and this is patient referred. For a submucosal lesion she may need an EGD/EUS and only Dr. Ardis Hughs performs EUS, so referral to him might be most appropriate. I do not see where she is established as my patient.

## 2014-03-22 ENCOUNTER — Encounter (HOSPITAL_COMMUNITY): Admission: RE | Payer: Self-pay | Source: Ambulatory Visit

## 2014-03-22 ENCOUNTER — Ambulatory Visit (HOSPITAL_COMMUNITY): Admission: RE | Admit: 2014-03-22 | Payer: Managed Care, Other (non HMO) | Source: Ambulatory Visit | Admitting: Surgery

## 2014-03-22 SURGERY — EGD (ESOPHAGOGASTRODUODENOSCOPY)
Anesthesia: Moderate Sedation

## 2014-03-22 SURGERY — Surgical Case
Anesthesia: *Unknown

## 2014-03-22 SURGERY — ENDOSCOPY, UPPER GI TRACT
Anesthesia: LOCAL

## 2014-03-29 ENCOUNTER — Ambulatory Visit (HOSPITAL_COMMUNITY)
Admission: RE | Admit: 2014-03-29 | Discharge: 2014-03-29 | Disposition: A | Discharge status: Home / Self Care | Payer: Managed Care, Other (non HMO) | Source: Ambulatory Visit | Attending: Surgery | Admitting: Surgery

## 2014-03-29 ENCOUNTER — Encounter (HOSPITAL_COMMUNITY)
Admission: RE | Disposition: A | Discharge status: Home / Self Care | Payer: Self-pay | Source: Ambulatory Visit | Attending: Surgery

## 2014-03-29 ENCOUNTER — Encounter (HOSPITAL_COMMUNITY): Payer: Self-pay | Admitting: *Deleted

## 2014-03-29 ENCOUNTER — Other Ambulatory Visit (INDEPENDENT_AMBULATORY_CARE_PROVIDER_SITE_OTHER): Payer: Self-pay | Admitting: Surgery

## 2014-03-29 DIAGNOSIS — K228 Other specified diseases of esophagus: Secondary | ICD-10-CM | POA: Diagnosis not present

## 2014-03-29 DIAGNOSIS — Z6841 Body Mass Index (BMI) 40.0 and over, adult: Secondary | ICD-10-CM | POA: Insufficient documentation

## 2014-03-29 DIAGNOSIS — K2289 Other specified disease of esophagus: Secondary | ICD-10-CM

## 2014-03-29 DIAGNOSIS — Z9089 Acquired absence of other organs: Secondary | ICD-10-CM | POA: Diagnosis not present

## 2014-03-29 HISTORY — PX: ESOPHAGOGASTRODUODENOSCOPY: SHX5428

## 2014-03-29 SURGERY — EGD (ESOPHAGOGASTRODUODENOSCOPY)
Anesthesia: Moderate Sedation

## 2014-03-29 MED ORDER — FENTANYL CITRATE 0.05 MG/ML IJ SOLN
INTRAMUSCULAR | Status: DC | PRN
  Administered 2014-03-29 (×2): 25 ug via INTRAVENOUS

## 2014-03-29 MED ORDER — DIPHENHYDRAMINE HCL 50 MG/ML IJ SOLN
INTRAMUSCULAR | Status: AC
  Filled 2014-03-29: qty 1

## 2014-03-29 MED ORDER — SODIUM CHLORIDE 0.9 % IV SOLN
INTRAVENOUS | Status: DC
  Administered 2014-03-29: 500 mL via INTRAVENOUS

## 2014-03-29 MED ORDER — BUTAMBEN-TETRACAINE-BENZOCAINE 2-2-14 % EX AERO
INHALATION_SPRAY | CUTANEOUS | Status: DC | PRN
  Administered 2014-03-29: 2 via TOPICAL

## 2014-03-29 MED ORDER — MIDAZOLAM HCL 10 MG/2ML IJ SOLN
INTRAMUSCULAR | Status: DC | PRN
  Administered 2014-03-29 (×2): 1 mg via INTRAVENOUS
  Administered 2014-03-29 (×2): 2 mg via INTRAVENOUS

## 2014-03-29 MED ORDER — MIDAZOLAM HCL 10 MG/2ML IJ SOLN
INTRAMUSCULAR | Status: AC
  Filled 2014-03-29: qty 4

## 2014-03-29 MED ORDER — FENTANYL CITRATE 0.05 MG/ML IJ SOLN
INTRAMUSCULAR | Status: AC
  Filled 2014-03-29: qty 4

## 2014-03-29 NOTE — Op Note (Addendum)
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 [photos in chart]  PROCEDURE:  Esophagogastroduedonoscopy  SURGEON:   Alphonsa Overall, M.D.  ANESTHESIA:   Fentanyl  50 mcg   Versed 6 mg  INDICATIONS FOR PROCEDURE:  [name] is a 38 y.o. (DOB: 05-15-1976)  AA  female whose primary care physician is [name],[name], MD and comes for upper endoscopy to evaluate esophageal mass.  She has seen Dr. Rockne Coons for weight loss surgery and is interested in a Lap Band.   The indications and risks of the endoscopy were explained to the patient.  The risks include, but are not limited to, perforation, bleeding, or injury to the bowel.  She had a prior endoscopy about 12 years ago.  PROCEDURE:  The patient was monitored with a pulse oximetry, BP cuff, and EKG.  The patient has nasal O2 flowing during the procedure.   The back of the throat was anesthestized with Ceticaine.  A flexible Pentax endoscope was passed down the throat without difficulty.  Findings include:   Esophagus:   Approximate 2.5 cm mass in distal esophagus at about 33 cm from incisors.  It appears submucosal.  Cold biopsies were taken x 2.   GE junction at:  37 cm  (so the mass appears 2 to 4 cm above the GE junction)   Stomach: Normal   Duodenum:   Normal to 2nd portion of duodenum  PLAN:  Follow up with Dr. Hassell Done  If she needs further endoscopy to evaluate this mass, she may be better served with anesthesia present.  Alphonsa Overall, MD, [facility] Pager: [phone] Office phone:  [phone]

## 2014-03-29 NOTE — H&P (View-Only) (Signed)
Sandy [name] 38 y.o.  Body mass index is 43.58 kg/(m^2).  Patient Active Problem List   Diagnosis Date Noted  . Morbid obesity 02/04/2014  . Ankle fracture, right 12/17/2013  . Closed right ankle fracture 12/17/2013    No Known Allergies    Past Surgical History  Procedure Laterality Date  . Cholecystectomy    . Orif ankle fracture Right 12/17/2013    Procedure: OPEN REDUCTION INTERNAL FIXATION (ORIF) RIGHT  ANKLE FRACTURE WITH SYNDESMOSIS SENSATION;  Surgeon: Johnn Hai, MD;  Location: [facility];  Service: Orthopedics;  Laterality: Right;  . Breast surgery      right benign tumor  . Fracture surgery      tiblia  . Dilatation & currettage/hysteroscopy with resectocope N/A 03/03/2014    Procedure: DILATATION & CURETTAGE/HYSTEROSCOPY WITH RESECTOSCOPE;  Surgeon: Marvene Staff, MD;  Location: [place] [facility];  Service: Gynecology;  Laterality: N/A;  . Breath tek h pylori N/A 03/15/2014    Procedure: BREATH TEK H PYLORI;  Surgeon: Pedro Earls, MD;  [facility]: [facility];  Service: Endoscopy;  Laterality: N/A;   [name],[name], MD No diagnosis found.  UGI reviewed with the patient and she was able to meet Dr. Lucia Gaskins.  Will schedule upper endoscopy with Natascha Edmonds/Newman the following week when both of [place] are here.   She is heading to her appt with nutrition.   Matt B. Hassell Done, MD, [facility] [phone] beeper [phone]  03/18/2014 11:06 AM

## 2014-03-29 NOTE — Interval H&P Note (Signed)
History and Physical Interval Note:  03/29/2014 7:20 AM  Sandy [name]  has presented today for surgery, with the diagnosis of abnornal UGI  The various methods of treatment have been discussed with the patient and family.  She has a mass in the distal esophagus which we will look at.  Her mother, Charlynn Court, is at the bedside.  After consideration of risks, benefits and other options for treatment, the patient has consented to  Procedure(s): ESOPHAGOGASTRODUODENOSCOPY (EGD)/UPPER ENDOSCOPY (N/A) as a surgical intervention .  The patient's history has been reviewed, patient examined, no change in status, stable for surgery.  I have reviewed the patient's chart and labs.  Questions were answered to the patient's satisfaction.     Nyima Vanacker H

## 2014-03-29 NOTE — Discharge Instructions (Addendum)
Conscious Sedation Sedation is the use of medicines to promote relaxation and relieve discomfort and anxiety. Conscious sedation is a type of sedation. Under conscious sedation you are less alert than normal but are still able to respond to instructions or stimulation. Conscious sedation is used during short medical and dental procedures. It is milder than deep sedation or general anesthesia and allows you to return to your regular activities sooner.  LET [facility] CARE PROVIDER KNOW ABOUT:   Any allergies you have.  All medicines you are taking, including vitamins, herbs, eye drops, creams, and over-the-counter medicines.  Use of steroids (by mouth or creams).  Previous problems you or members of your family have had with the use of anesthetics.  Any blood disorders you have.  Previous surgeries you have had.  Medical conditions you have.  Possibility of pregnancy, if this applies.  Use of cigarettes, alcohol, or illegal drugs. RISKS AND COMPLICATIONS Generally, this is a safe procedure. However, as with any procedure, problems can occur. Possible problems include:  Oversedation.  Trouble breathing on your own. You may need to have a breathing tube until you are awake and breathing on your own.  Allergic reaction to any of the medicines used for the procedure. BEFORE THE PROCEDURE  You may have blood tests done. These tests can help show how well your kidneys and liver are working. They can also show how well your blood clots.  A physical exam will be done.  Only take medicines as directed by your health care provider. You may need to stop taking medicines (such as blood thinners, aspirin, or nonsteroidal anti-inflammatory drugs) before the procedure.   Do not eat or drink at least 6 hours before the procedure or as directed by your health care provider.  Arrange for a responsible adult, family member, or friend to take you home after the procedure. He or she should stay  with you for at least 24 hours after the procedure, until the medicine has worn off. PROCEDURE   An intravenous (IV) catheter will be inserted into one of your veins. Medicine will be able to flow directly into your body through this catheter. You may be given medicine through this tube to help prevent pain and help you relax.  The medical or dental procedure will be done. AFTER THE PROCEDURE  You will stay in a recovery area until the medicine has worn off. Your blood pressure and pulse will be checked.   Depending on the procedure you had, you may be allowed to go home when you can tolerate liquids and your pain is under control. Document Released: 04/10/2001 Document Revised: 07/21/2013 Document Reviewed: 03/23/2013 [facility] Patient Information 2015 [place], [place]. This information is not intended to replace advice given to you by your health care provider. Make sure you discuss any questions you have with your health care provider. Esophagogastroduodenoscopy Care After Refer to this sheet in the next few weeks. These instructions provide you with information on caring for yourself after your procedure. Your caregiver may also give you more specific instructions. Your treatment has been planned according to current medical practices, but problems sometimes occur. Call your caregiver if you have any problems or questions after your procedure.  HOME CARE INSTRUCTIONS  Do not eat or drink anything until the numbing medicine (local anesthetic) has worn off and your gag reflex has returned. You will know that the local anesthetic has worn off when you can swallow comfortably.  Do not drive for 12 hours  after the procedure or as directed by your caregiver.  Only take medicines as directed by your caregiver. SEEK MEDICAL CARE IF:   You cannot stop coughing.  You are not urinating at all or less than usual. SEEK IMMEDIATE MEDICAL CARE IF:  You have difficulty swallowing.  You cannot eat  or drink.  You have worsening throat or chest pain.  You have dizziness, lightheadedness, or you faint.  You have nausea or vomiting.  You have chills.  You have a fever.  You have severe abdominal pain.  You have black, tarry, or bloody stools. Document Released: 07/02/2012 Document Reviewed: 07/02/2012 [facility] Patient Information 2015 [place]. This information is not intended to replace advice given to you by your health care provider. Make sure you discuss any questions you have with your health care provider.

## 2014-03-30 ENCOUNTER — Encounter (HOSPITAL_COMMUNITY): Payer: Self-pay | Admitting: Surgery

## 2014-04-10 ENCOUNTER — Ambulatory Visit: Payer: Managed Care, Other (non HMO) | Admitting: Dietician

## 2014-04-20 ENCOUNTER — Other Ambulatory Visit (INDEPENDENT_AMBULATORY_CARE_PROVIDER_SITE_OTHER): Payer: Self-pay

## 2014-04-20 ENCOUNTER — Telehealth: Payer: Self-pay

## 2014-04-20 DIAGNOSIS — K2289 Other specified disease of esophagus: Secondary | ICD-10-CM

## 2014-04-20 DIAGNOSIS — K228 Other specified diseases of esophagus: Secondary | ICD-10-CM

## 2014-04-20 NOTE — Telephone Encounter (Signed)
Message copied by Barron Alvine on Tue Apr 20, 2014  2:37 PM ------      Message from: Darden Dates      Created: Tue Apr 20, 2014  2:24 PM       Christy with [facility] called to get this patient set up with Dr. Ardis Hughs asap.  I see something with Dr. Wynetta Emery just last month.  Told Christy about that and she insists that Dr. Wynetta Emery is not a GI doctor.  Can you call her about this referral at [phone]?  Thank you!!      Amy ------

## 2014-04-21 ENCOUNTER — Other Ambulatory Visit: Payer: Self-pay

## 2014-04-21 NOTE — Telephone Encounter (Signed)
I spoke with Dr. Hassell Done about her recently.  OK to book as NGI pt with me next week, double book if needed,   Thanks

## 2014-04-21 NOTE — Telephone Encounter (Signed)
Sandy [name], this is a [name] pt.  Look at the procedure from 2005.  It looks like the pt switched to [name], she had a UGI last month.  I called [name] office but the office is closed for lunch.  Let me know if I need to do something but per [name] he was to be scheduled back with him.  Also, see note from 03/18/14 from [place].

## 2014-04-21 NOTE — Telephone Encounter (Signed)
Patient is scheduled for 3:15 on 04/27/14.  Appt scheduled with the patient

## 2014-04-21 NOTE — Telephone Encounter (Signed)
Patient has a remote history with Dr. Fuller Plan in 2005.  She only had a breath- tek for bariatric surgery ( ordered on all bariatric surgery patients)read by Dr. Wynetta Emery, she has never seen him.  She has an esophageal mass and Dr. Hassell Done is requesting an EUS.  Dr. Ardis Hughs will you please review the chart and see if EUS can be scheduled.

## 2014-04-27 ENCOUNTER — Ambulatory Visit (INDEPENDENT_AMBULATORY_CARE_PROVIDER_SITE_OTHER): Payer: Managed Care, Other (non HMO) | Admitting: Gastroenterology

## 2014-04-27 ENCOUNTER — Encounter: Payer: Self-pay | Admitting: Gastroenterology

## 2014-04-27 ENCOUNTER — Encounter (HOSPITAL_COMMUNITY): Payer: Self-pay | Admitting: *Deleted

## 2014-04-27 VITALS — BP 102/74 | HR 68 | Ht 61.0 in | Wt 238.2 lb

## 2014-04-27 DIAGNOSIS — K229 Disease of esophagus, unspecified: Secondary | ICD-10-CM

## 2014-04-27 NOTE — Patient Instructions (Signed)
You have been scheduled for an EUS. Please follow written instructions given to you at your visit today.

## 2014-04-27 NOTE — Progress Notes (Signed)
HPI: This is a   very pleasant 38 year old, morbidly obese woman whom I am meeting for the first time today.  She underwent upper GI barium esophagram recently as workup for up pending bariatric surgery. That noted a mid esophageal mass, see below. Followup EGD was performed. Those results are below as well  UGI barium test 02/2014 showed smooth mass in distal thoracic esophagus EGD 03/2014 (images reviewed).  "2.5cm mass at 33 cm from incisors, biopsied".  Biopsies did not demonstrate underlying pathology.  She has no dysphasia symptoms. She has no family history or personal history of esophageal disorders.  Review of systems: Pertinent positive and negative review of systems were noted in the above HPI section. Complete review of systems was performed and was otherwise normal.    Past Medical History  Diagnosis Date  . Numbness     OCASIONAL NUMBNESS RT LEG  . Fracture of ankle     RT    Past Surgical History  Procedure Laterality Date  . Cholecystectomy    . Orif ankle fracture Right 12/17/2013    Procedure: OPEN REDUCTION INTERNAL FIXATION (ORIF) RIGHT  ANKLE FRACTURE WITH SYNDESMOSIS SENSATION;  Surgeon: Johnn Hai, MD;  Location: [facility];  Service: Orthopedics;  Laterality: Right;  . Breast surgery      right benign tumor  . Dilatation & currettage/hysteroscopy with resectocope N/A 03/03/2014    Procedure: DILATATION & CURETTAGE/HYSTEROSCOPY WITH RESECTOSCOPE;  Surgeon: Marvene Staff, MD;  Location: [place] [facility];  Service: Gynecology;  Laterality: N/A;  . Breath tek h pylori N/A 03/15/2014    Procedure: BREATH TEK H PYLORI;  Surgeon: Pedro Earls, MD;  [facility];  Service: Endoscopy;  Laterality: N/A;  . Fracture surgery      tiblia ,fibula  . Esophagogastroduodenoscopy N/A 03/29/2014    Procedure: ESOPHAGOGASTRODUODENOSCOPY (EGD)/UPPER ENDOSCOPY;  Surgeon: Shann Medal, MD;  Location: [facility];  Service: General;  Laterality: N/A;    Current  Outpatient Prescriptions  Medication Sig Dispense Refill  . ibuprofen (ADVIL,MOTRIN) 800 MG tablet Take 1 tablet (800 mg total) by mouth every 8 (eight) hours as needed.  30 tablet  0  . oxyCODONE-acetaminophen (PERCOCET) 7.5-325 MG per tablet Take 1-2 tablets by mouth every 4 (four) hours as needed for pain.  50 tablet  0   No current facility-administered medications for this visit.    Allergies as of 04/27/2014 - Review Complete 04/27/2014  Allergen Reaction Noted  . Percocet [oxycodone-acetaminophen] Itching 03/29/2014    No family history on file.  History   Social History  . Marital Status: Single    Spouse Name: N/A    Number of Children: N/A  . Years of Education: N/A   Occupational History  . Not on file.   Social History Main Topics  . Smoking status: Never Smoker   . Smokeless tobacco: Never Used  . Alcohol Use: Yes     Comment: OCCASIONAL  . Drug Use: No  . Sexual Activity: Yes    Birth Control/ Protection: None   Other Topics Concern  . Not on file   Social History Narrative  . No narrative on file       Physical Exam: BP 102/74  Pulse 68  Ht 5\' 1"  (1.549 m)  Wt 238 lb 3.2 oz (108.047 kg)  BMI 45.03 kg/m2  LMP 04/25/2014 Constitutional: Morbidly obese, otherwise well-appearing Psychiatric: alert and oriented x3 Eyes: extraocular movements intact Mouth: oral pharynx moist, no lesions Neck: supple no lymphadenopathy Cardiovascular:  heart regular rate and rhythm Lungs: clear to auscultation bilaterally Abdomen: soft, nontender, nondistended, no obvious ascites, no peritoneal signs, normal bowel sounds Extremities: no lower extremity edema bilaterally Skin: no lesions on visible extremities    Assessment and plan: 38 y.o. female with  incidentally noted asymptomatic submucosal lesion of the distal esophagus  The endoscopic pictures of this suggests to me that it is a submucosal lesion, perhaps leiomyoma or more rarely GIST type lesion.  Those generally involve the muscularis propria wall and endoscopic removal would necessarily cause perforation. Interestingly it is not causing any dysphasia symptoms. I would like to get more information about exactly what this is and what layer of esophageal wall is involved. I discussed my plan with Dr. Hassell Done and we agreed to proceed with endoscopic ultrasound to get good measurements, anatomic distance from the GE junction, fine needle aspiration.

## 2014-04-28 ENCOUNTER — Encounter (HOSPITAL_COMMUNITY): Payer: Self-pay | Admitting: Pharmacy Technician

## 2014-04-29 ENCOUNTER — Telehealth: Payer: Self-pay | Admitting: Gastroenterology

## 2014-04-29 NOTE — Telephone Encounter (Signed)
The pt has been re scheduled to 05/06/14  She is aware and has been re instructed

## 2014-05-06 ENCOUNTER — Ambulatory Visit (HOSPITAL_COMMUNITY)
Admission: RE | Admit: 2014-05-06 | Payer: Managed Care, Other (non HMO) | Source: Ambulatory Visit | Admitting: Gastroenterology

## 2014-05-06 ENCOUNTER — Telehealth: Payer: Self-pay | Admitting: Gastroenterology

## 2014-05-06 SURGERY — UPPER ENDOSCOPIC ULTRASOUND (EUS) LINEAR
Anesthesia: Monitor Anesthesia Care

## 2014-05-06 NOTE — Telephone Encounter (Signed)
Pt states she has decided to go with another provider closer to home because she lives in [place]. Dr. Ardis Hughs notified.

## 2014-05-06 NOTE — Telephone Encounter (Signed)
Ok, can you please let Dr. Kaylyn Lim know about her decision.  thanks

## 2014-05-06 NOTE — Telephone Encounter (Signed)
Note sent to Dr. Hassell Done.

## 2014-07-24 ENCOUNTER — Encounter (HOSPITAL_BASED_OUTPATIENT_CLINIC_OR_DEPARTMENT_OTHER): Payer: Self-pay | Admitting: Emergency Medicine

## 2014-07-24 ENCOUNTER — Emergency Department (HOSPITAL_BASED_OUTPATIENT_CLINIC_OR_DEPARTMENT_OTHER): Payer: Managed Care, Other (non HMO)

## 2014-07-24 ENCOUNTER — Emergency Department (HOSPITAL_BASED_OUTPATIENT_CLINIC_OR_DEPARTMENT_OTHER)
Admission: EM | Admit: 2014-07-24 | Discharge: 2014-07-24 | Disposition: A | Discharge status: Home / Self Care | Payer: Managed Care, Other (non HMO) | Attending: Emergency Medicine | Admitting: Emergency Medicine

## 2014-07-24 DIAGNOSIS — S39012A Strain of muscle, fascia and tendon of lower back, initial encounter: Secondary | ICD-10-CM | POA: Diagnosis not present

## 2014-07-24 DIAGNOSIS — S0083XA Contusion of other part of head, initial encounter: Secondary | ICD-10-CM | POA: Diagnosis not present

## 2014-07-24 DIAGNOSIS — S3992XA Unspecified injury of lower back, initial encounter: Secondary | ICD-10-CM | POA: Diagnosis present

## 2014-07-24 DIAGNOSIS — Y92410 Unspecified street and highway as the place of occurrence of the external cause: Secondary | ICD-10-CM | POA: Insufficient documentation

## 2014-07-24 DIAGNOSIS — R52 Pain, unspecified: Secondary | ICD-10-CM

## 2014-07-24 DIAGNOSIS — Y998 Other external cause status: Secondary | ICD-10-CM | POA: Diagnosis not present

## 2014-07-24 DIAGNOSIS — Y9389 Activity, other specified: Secondary | ICD-10-CM | POA: Diagnosis not present

## 2014-07-24 MED ORDER — TRAMADOL HCL 50 MG PO TABS: 50.0000 mg | ORAL_TABLET | Freq: Four times a day (QID) | ORAL | Status: DC | PRN

## 2014-07-24 MED ORDER — TRAMADOL HCL 50 MG PO TABS: 50.0000 mg | ORAL_TABLET | Freq: Once | ORAL | Status: DC

## 2014-07-24 MED ORDER — ACETAMINOPHEN 500 MG PO TABS
1000.0000 mg | ORAL_TABLET | Freq: Once | ORAL | Status: AC
  Administered 2014-07-24: 1000 mg via ORAL
  Filled 2014-07-24: qty 2
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
## 2015-02-28 DIAGNOSIS — Z6841 Body Mass Index (BMI) 40.0 and over, adult: Secondary | ICD-10-CM | POA: Insufficient documentation

## 2015-05-12 DIAGNOSIS — Z9889 Other specified postprocedural states: Secondary | ICD-10-CM

## 2015-05-12 HISTORY — PX: ESOPHAGECTOMY, ROBOT-ASSISTED: SHX7139

## 2015-05-12 HISTORY — DX: Other specified postprocedural states: Z98.890

## 2015-06-14 DIAGNOSIS — D130 Benign neoplasm of esophagus: Secondary | ICD-10-CM | POA: Insufficient documentation

## 2016-01-09 IMAGING — RF DG UGI W/ KUB
14 of 21 series · 14 of 24 positions shown · non-contrast
Comparison: None.

CLINICAL DATA: Morbid obesity. Pre-op evaluation for bariatric
surgery.

EXAM:
UPPER GI SERIES WITH KUB
TECHNIQUE: After obtaining a scout radiograph a routine upper GI series was
performed using thin barium.
FLUOROSCOPY TIME:  2 min 36 seconds

[Series 1: run · 1 of 2 slices shown (1 of 13)]
[im 1/2]
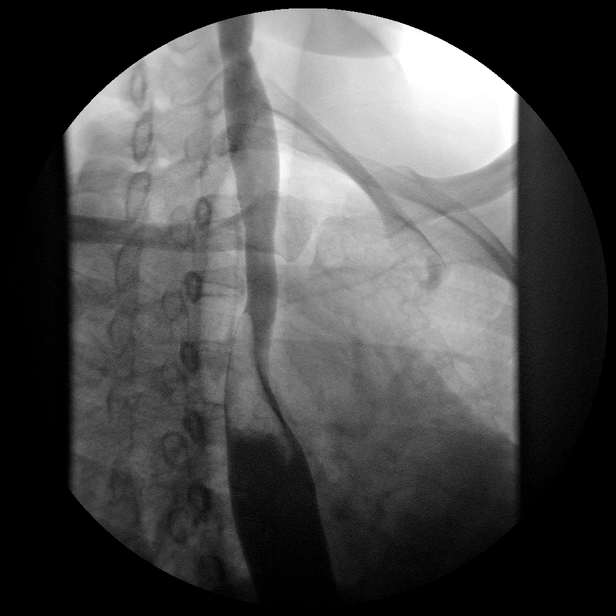

[Series 2: run · 1 of 2 slices shown (2 of 13)]
[im 1/2]
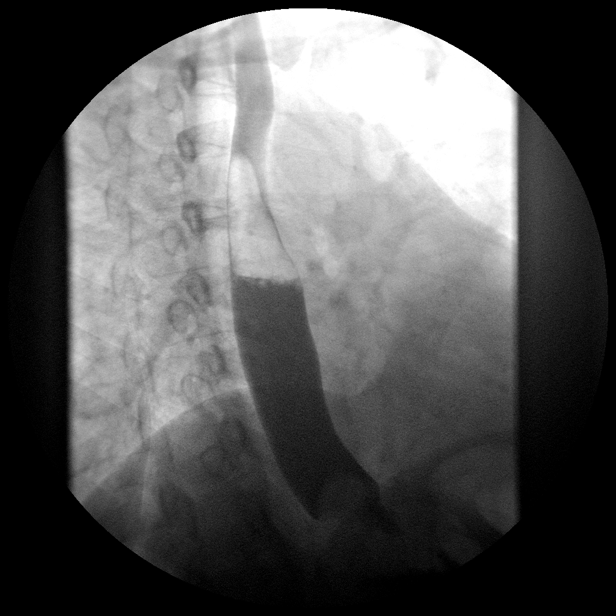

[Series 3: run · 1 of 2 slices shown (3 of 13)]
[im 1/2]
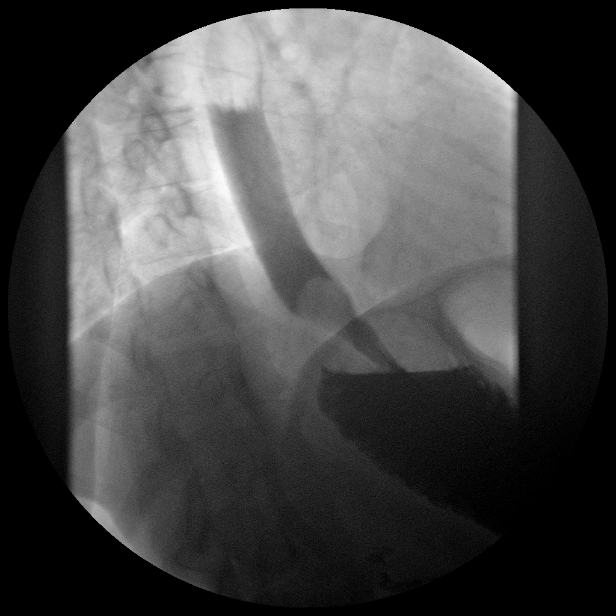

[Series 4: run · 1 of 2 slices shown (4 of 13)]
[im 1/2]
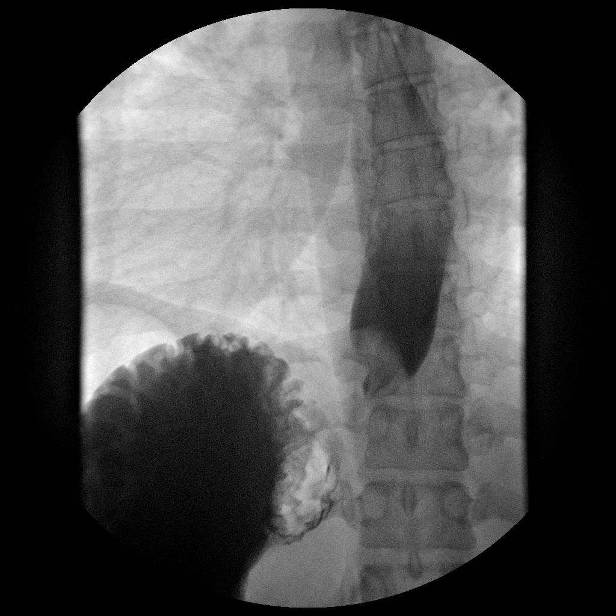

[Series 5: run · 1 of 2 slices shown (5 of 13)]
[im 1/2]
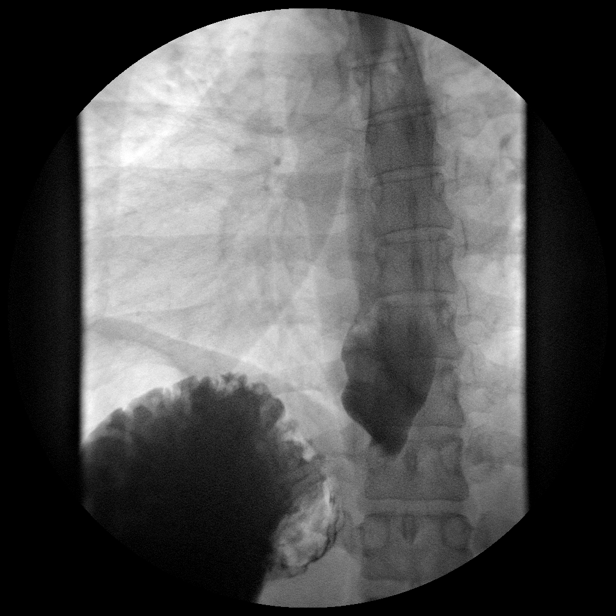

[Series 7: run · 1 of 2 slices shown (6 of 13)]
[im 1/2]
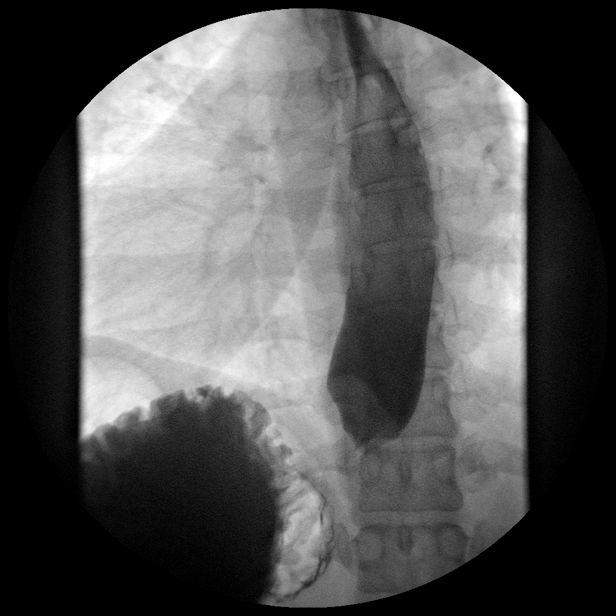

[Series 9: run · 1 of 2 slices shown (7 of 13)]
[im 1/2]
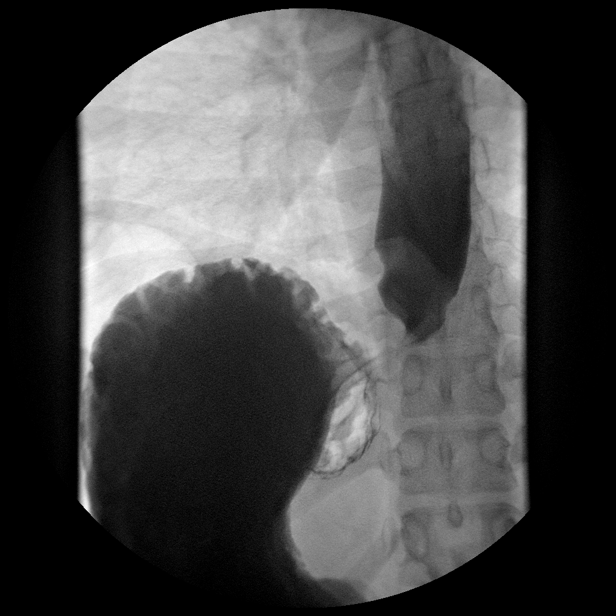

[Series 10: run · 1 of 2 slices shown (8 of 13)]
[im 1/2]
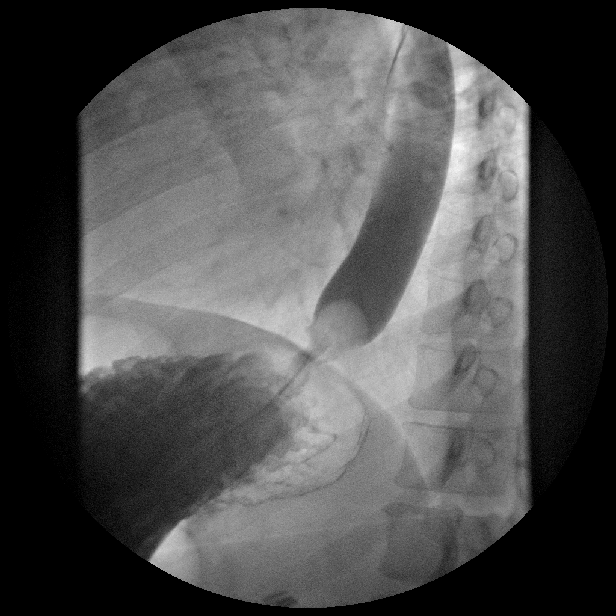

[Series 12: run · 1 of 2 slices shown (9 of 13)]
[im 1/2]
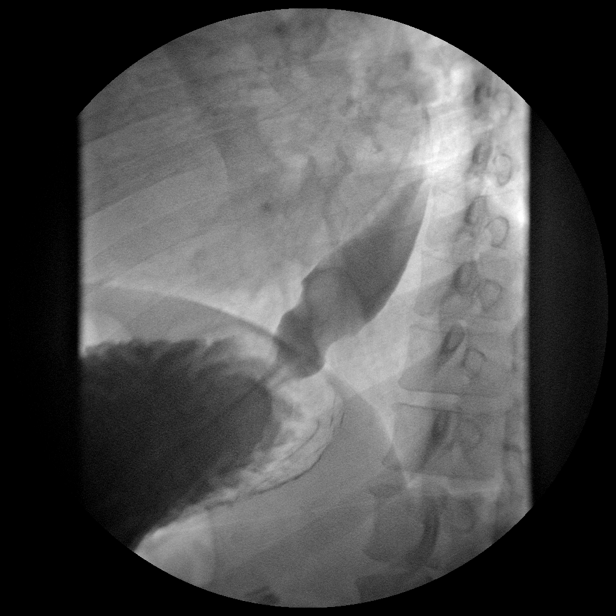

[Series 14: run · 1 of 2 slices shown (10 of 13)]
[im 1/2]
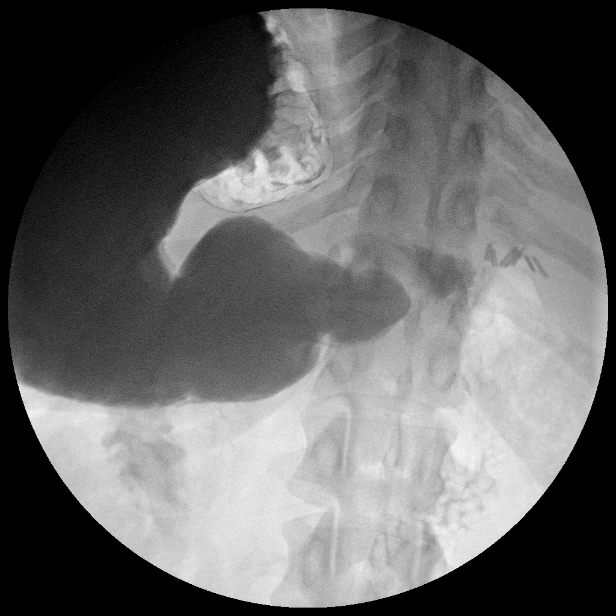

[Series 16: run · 1 of 2 slices shown (11 of 13)]
[im 1/2]
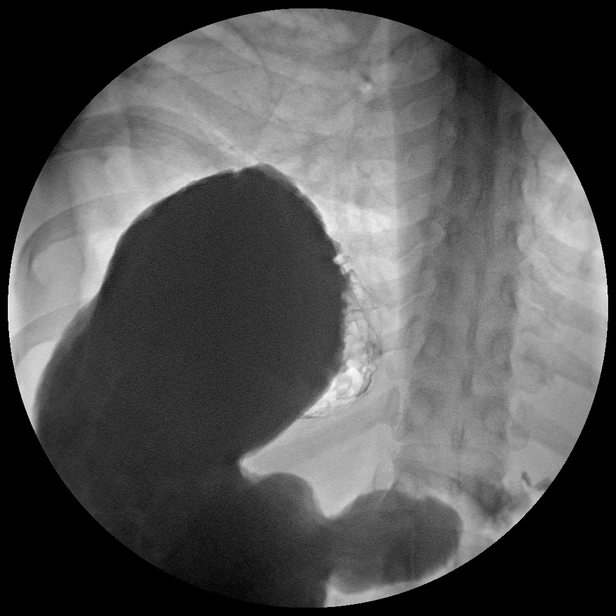

[Series 17: run · 1 of 2 slices shown (12 of 13)]
[im 1/2]
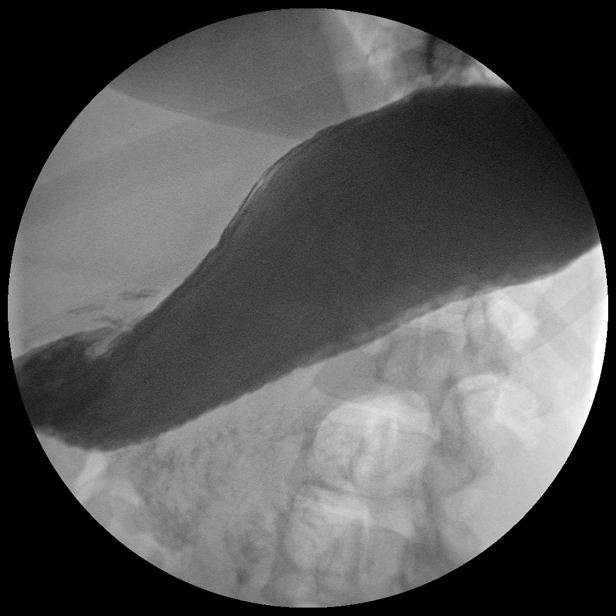

[Series 19: run · 1 of 2 slices shown (13 of 13)]
[im 1/2]
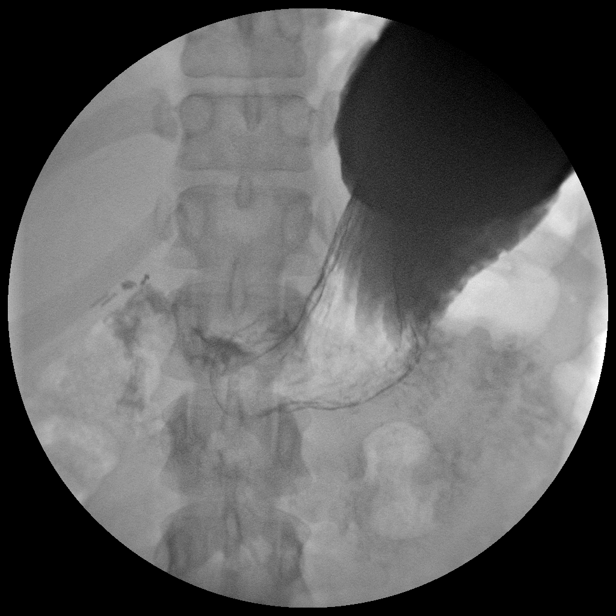

[Series 1002: view not recorded · 0.20mm/px · 1 of 1 slices shown]
[im 1/1]
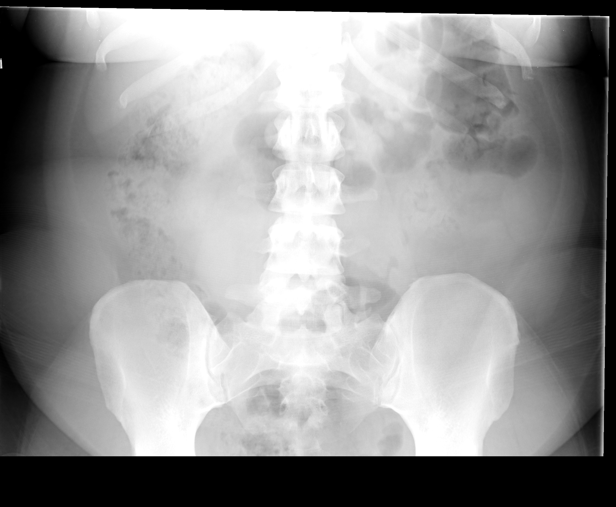

[14 of 24 positions shown; findings below may reference images not displayed]

FINDINGS: Scout radiograph: Unremarkable bowel gas pattern. Surgical clips
seen from prior cholecystectomy.

Esophagus: A persistent smoothly marginated masslike filling defect
is seen in the distal thoracic esophagus which appears submucosal in
location. These characteristics are suspicious for a esophageal
leiomyoma or other submucosal mass, with carcinoma considered much
less likely. There is no evidence of esophageal obstruction.
Esophageal motility is within normal limits. No evidence of
gastroesophageal reflux of contrast.

Stomach: No hiatal hernia visualized. No evidence of gastric mass or
ulcer.

Duodenum:  No ulcer or other significant abnormality identified.

Other:  None.
IMPRESSION: Smoothly marginated masslike filling defect in the distal thoracic
esophagus, suspicious for a leiomyoma or other submucosal mass.
Esophageal carcinoma is considered much less likely. Upper endoscopy
is recommended for further evaluation.

These results will be called to the ordering clinician or
representative by the Radiologist Assistant, and communication
documented in the PACS or zVision Dashboard.

## 2017-07-12 DIAGNOSIS — M792 Neuralgia and neuritis, unspecified: Secondary | ICD-10-CM | POA: Insufficient documentation

## 2018-02-14 ENCOUNTER — Other Ambulatory Visit: Payer: Self-pay | Admitting: Obstetrics and Gynecology

## 2018-02-14 DIAGNOSIS — R1012 Left upper quadrant pain: Secondary | ICD-10-CM

## 2018-02-14 DIAGNOSIS — D259 Leiomyoma of uterus, unspecified: Secondary | ICD-10-CM

## 2018-02-26 ENCOUNTER — Other Ambulatory Visit: Payer: Managed Care, Other (non HMO)

## 2018-05-09 ENCOUNTER — Other Ambulatory Visit: Payer: 59

## 2018-05-16 ENCOUNTER — Ambulatory Visit
Admission: RE | Admit: 2018-05-16 | Discharge: 2018-05-16 | Disposition: A | Discharge status: Home / Self Care | Payer: 59 | Source: Ambulatory Visit | Attending: Obstetrics and Gynecology | Admitting: Obstetrics and Gynecology

## 2018-05-16 DIAGNOSIS — D259 Leiomyoma of uterus, unspecified: Secondary | ICD-10-CM

## 2018-05-23 ENCOUNTER — Other Ambulatory Visit: Payer: 59

## 2018-10-07 ENCOUNTER — Other Ambulatory Visit: Payer: Self-pay | Admitting: Surgery

## 2018-10-07 DIAGNOSIS — R1012 Left upper quadrant pain: Secondary | ICD-10-CM

## 2018-10-08 ENCOUNTER — Other Ambulatory Visit: Payer: Self-pay | Admitting: Surgery

## 2018-10-08 ENCOUNTER — Other Ambulatory Visit (HOSPITAL_COMMUNITY): Payer: Self-pay | Admitting: Surgery

## 2018-10-13 ENCOUNTER — Other Ambulatory Visit: Payer: Self-pay

## 2018-10-13 ENCOUNTER — Ambulatory Visit
Admission: RE | Admit: 2018-10-13 | Discharge: 2018-10-13 | Disposition: A | Discharge status: Home / Self Care | Payer: 59 | Source: Ambulatory Visit | Attending: Surgery | Admitting: Surgery

## 2018-10-13 DIAGNOSIS — R1012 Left upper quadrant pain: Secondary | ICD-10-CM

## 2018-10-13 MED ORDER — IOPAMIDOL (ISOVUE-300) INJECTION 61%
125.0000 mL | Freq: Once | INTRAVENOUS | Status: AC | PRN
  Administered 2018-10-13: 125 mL via INTRAVENOUS

## 2018-10-14 ENCOUNTER — Ambulatory Visit: Payer: 59 | Admitting: Dietician

## 2018-10-23 ENCOUNTER — Encounter: Payer: 59 | Attending: Surgery | Admitting: Skilled Nursing Facility1

## 2018-10-23 ENCOUNTER — Other Ambulatory Visit: Payer: Self-pay

## 2018-10-23 ENCOUNTER — Encounter: Payer: Self-pay | Admitting: Skilled Nursing Facility1

## 2018-10-23 NOTE — Progress Notes (Addendum)
Pre-Op Assessment Visit:  Pre-Operative Sleeve Gastrectomy or Gastric Banding Surgery  Medical Nutrition Therapy:  Appt start time: 9:15  End time:  10:15  Patient was seen on 10/23/2018 for Pre-Operative Nutrition Assessment. Assessment and letter of approval faxed to [facility] Bariatric Surgery Program coordinator on 10/23/2018.   Pt states she started this process about 5 years ago but had to stop due to a mass found in her esophagus. Pt states she was diagnosed with diverticulosis. Pt states she has upper left sided pain intermittently which feels like a cramp. Pt states she has bowel movements 4-5 times a day sometimes watery sometimes solid for the last 6 months, sometimes she has a little reflux 3-4 times a week after having eaten. Pt has had cholecystectomy around 2004-2006. Pt states sometimes she has foul floaty stools.  Pt states she does feel lethargic and sometimes her stool is a coffee ground color.  Pt states she sometimes has a lot of bloat.   Pt was educated on her insurance requirements in regards to the inability to gain weight.  According to pts referral: 3 SWL  Goals: Red meat: beef and pork 1 time a week Chicken and fish and [place] the other day Get a multivitamin with iron Veggies 2 times a day Track everything you eat and drink and signs/symptoms  30 grams of fiber   Pt expectation of surgery: to lose weight and create a healthier lifestyle   Pt expectation of Dietitian: to help me with strategies   Start weight at [facility]: 248.2 BMI: 46.14  24 hr Dietary Recall: 1-2 meals a day and 1-2 snacks  First Meal: skipped (waking at 7:30-8:30) Snack: 11-12a/pm first meal: leftovers or bacon or eggs with toast or fried pork chops or chicken legs Second Meal: 1-3pm: chips or chex mix  Snack:  Third Meal: 5-7pm:  leftovers or bacon or eggs with toast or fried pork chops or chicken legs with bread Snack: chips or trail mix  Waking in the middle of the night to  eat leftovers  Beverages: water, occasional cocktail or wine or mike hard lemonade couple times a week  Encouraged to engage in 150 minutes of moderate physical activity including cardiovascular and weight baring weekly  Handouts given during visit include:  . Pre-Op Goals . Bariatric Surgery Protein Shakes During the appointment today the following Pre-Op Goals were reviewed with the patient: . Log everything you eat and drink along with signs and symptoms  . Make healthy food choices . Begin to limit portion sizes . Limited concentrated sugars and fried foods . Keep fat/sugar in the single digits per serving on             food labels . Practice CHEWING your food  (aim for 30 chews per bite or until applesauce consistency) . Practice not drinking 15 minutes before, during, and 30 minutes after each meal/snack . Avoid all carbonated beverages  . Avoid/limit caffeinated beverages  . Avoid all sugar-sweetened beverages . Consume 3 meals per day; eat every 3-5 hours . Make a list of non-food related activities . Aim for 64-100 ounces of FLUID daily  . Aim for at least 60-80 grams of PROTEIN daily . Look for a liquid protein source that contain ?15 g protein and ?5 g carbohydrate  (ex: shakes, drinks, shots)  -Follow diet recommendations listed below   Energy and Macronutrient Recomendations: Calories: 1500 Carbohydrate: 170 Protein: 112 Fat: 42  Demonstrated degree of understanding via:  Teach Back  Teaching Method Utilized:  Visual Auditory Hands on  Barriers to learning/adherence to lifestyle change: none identified   Patient to call the Nutrition and Diabetes Education Services to enroll in Pre-Op and Post-Op Nutrition Education when surgery date is scheduled.

## 2018-11-10 ENCOUNTER — Ambulatory Visit (HOSPITAL_COMMUNITY): Payer: 59

## 2018-11-10 ENCOUNTER — Other Ambulatory Visit (HOSPITAL_COMMUNITY): Payer: 59

## 2018-11-24 ENCOUNTER — Ambulatory Visit: Payer: 59 | Admitting: Skilled Nursing Facility1

## 2018-11-25 ENCOUNTER — Ambulatory Visit (HOSPITAL_COMMUNITY): Payer: 59

## 2018-11-25 ENCOUNTER — Ambulatory Visit: Payer: 59 | Admitting: Skilled Nursing Facility1

## 2018-11-25 ENCOUNTER — Encounter (HOSPITAL_COMMUNITY): Payer: Self-pay

## 2019-02-10 ENCOUNTER — Other Ambulatory Visit: Payer: Self-pay

## 2019-02-10 ENCOUNTER — Encounter: Payer: 59 | Attending: Surgery | Admitting: Skilled Nursing Facility1

## 2019-02-10 NOTE — Progress Notes (Signed)
Bariatric Supervised Weight Loss Visit  1st SWL   Referral Stated SWL Appointments Required: 3  Proposed Surgery Type: Sleeve or Band   NUTRITION ASSESSMENT   Anthropometrics  Start weight at [facility]: 248.3 lbs Today's weight: 242.6 lbs Weight change: -5.7 lbs (since previous nutrition appointment)  BMI: 45.10 kg/m2     Psychosocial/Lifestyle  Dx: diverticulosis  Pt states she has been stressed with work. Pt states she was on her menstrual cycle at her last appt. Pt states she did not work on the logging goal from previous visit stating she has been working a lot.  Dietitian educated pt on diverticulosis and the importance of a proper diet to prevent flare ups.   Medications:  Labs:  Notable Signs/Symptoms Resolved since last visit  24-Hr Dietary Recall: grabbing throughout the day First Meal 10-11am: whatever's in the fridge: chicken or steak or ribs Snack:  Second Meal 3-4pm:  whatever's in the fridge: chicken or steak or ribs or eating out  Snack:  Third Meal: whatever's in the fridge: chicken or steak or ribs or eating out Snack:  Beverages: water  Physical Activity  ADL's   Percent Successful in meeting last appointments goals:  0% (as stated by pt)   Pre-Op Goals Previously Chosen     Estimated Energy Needs Calories: 1400 Carbohydrate: 158 Protein: 105 Fat: 39     NUTRITION DIAGNOSIS  Overweight/obesity ([place]-3.3) related to past poor dietary habits and physical inactivity as evidenced by patient w/ planned Sleeve or band surgery following dietary guidelines for continued weight loss.     NUTRITION INTERVENTION  Nutrition counseling (C-1) and education (E-2) to facilitate bariatric surgery goals.   New Pre-Op Goals to Work On 3 yetis a day: minimum  Eat non-starchy vegetables 2 times a day Try the carb balance tortillas    Handouts Provided Include      Change readiness: pre contemplative   Demonstrated degree of understanding via: Teach Back       MONITORING & EVALUATION Dietary intake, weekly physical activity, body weight, and pre-op goals.

## 2019-03-10 ENCOUNTER — Ambulatory Visit: Payer: 59 | Admitting: Skilled Nursing Facility1

## 2019-03-17 ENCOUNTER — Ambulatory Visit: Payer: 59 | Admitting: Skilled Nursing Facility1

## 2019-03-19 ENCOUNTER — Ambulatory Visit: Payer: 59 | Admitting: Skilled Nursing Facility1

## 2019-03-24 ENCOUNTER — Other Ambulatory Visit: Payer: Self-pay

## 2019-03-24 ENCOUNTER — Encounter: Payer: 59 | Attending: Surgery | Admitting: Skilled Nursing Facility1

## 2019-03-24 NOTE — Progress Notes (Signed)
Bariatric Supervised Weight Loss Visit  2nd SWL   Referral Stated SWL Appointments Required: 3  Proposed Surgery Type: Sleeve or Band   NUTRITION ASSESSMENT   Anthropometrics  Start weight at [facility]: 248.3 lbs Today's weight: 243.2 lbs Weight change: +.6 lbs (since previous nutrition appointment)  BMI: 45.21 kg/m2     Psychosocial/Lifestyle  Dx: diverticulosis  Pt arrives stating she is currently menstruating and bloated.  Pt states she takes welbutrin now stating her energy levels have increased and she feels more alert stating this has made her feel better mentally to start working out. Pt states she thinks she will struggle with taking calcium ever day after surgery from remembering to do it. Pt states she is still struggling with structure in her day with her meals.   Medications:  Labs:  Notable Signs/Symptoms Resolved since last visit  24-Hr Dietary Recall: grabbing throughout the day First Meal 12pm: leftover seafood Snack:  Second Meal 3-4pm:  skipped Snack: pickles Third Meal: skipped Snack:  Beverages: water  Physical Activity  ADL's   Percent Successful in meeting last appointments goals:  65% (as stated by pt)   Pre-Op Goals Previously Chosen 3 yetis a day: minimum  Eat non-starchy vegetables 2 times a day Try the carb balance tortillas     Estimated Energy Needs Calories: 1400 Carbohydrate: 158 Protein: 105 Fat: 39     NUTRITION DIAGNOSIS  Overweight/obesity ([place]-3.3) related to past poor dietary habits and physical inactivity as evidenced by patient w/ planned Sleeve or band surgery following dietary guidelines for continued weight loss.     NUTRITION INTERVENTION  Nutrition counseling (C-1) and education (E-2) to facilitate bariatric surgery goals.   New Pre-Op Goals to Work On [employer] today (03/24/2019) Before starting work eat breakfast  Home exercise 1 time a week progressing to 3 times a week by next visit   Handouts  Provided Include      Change readiness: contemplative   Demonstrated degree of understanding via: Teach Back      MONITORING & EVALUATION Dietary intake, weekly physical activity, body weight, and pre-op goals.

## 2019-04-14 ENCOUNTER — Ambulatory Visit: Payer: 59 | Admitting: Skilled Nursing Facility1

## 2019-04-28 ENCOUNTER — Ambulatory Visit: Payer: 59 | Admitting: Dietician

## 2019-07-14 ENCOUNTER — Ambulatory Visit: Payer: 59 | Admitting: Adult Health

## 2019-08-19 ENCOUNTER — Other Ambulatory Visit: Payer: Self-pay

## 2019-08-19 ENCOUNTER — Ambulatory Visit
Admission: EM | Admit: 2019-08-19 | Discharge: 2019-08-19 | Disposition: A | Discharge status: Home / Self Care | Payer: 59 | Attending: Emergency Medicine | Admitting: Emergency Medicine

## 2019-08-19 DIAGNOSIS — J0100 Acute maxillary sinusitis, unspecified: Secondary | ICD-10-CM | POA: Insufficient documentation

## 2019-08-19 DIAGNOSIS — R05 Cough: Secondary | ICD-10-CM | POA: Diagnosis not present

## 2019-08-19 DIAGNOSIS — J029 Acute pharyngitis, unspecified: Secondary | ICD-10-CM | POA: Insufficient documentation

## 2019-08-19 DIAGNOSIS — J0101 Acute recurrent maxillary sinusitis: Secondary | ICD-10-CM

## 2019-08-19 LAB — POCT RAPID STREP A (OFFICE): Rapid Strep A Screen: NEGATIVE

## 2019-08-19 MED ORDER — AMOXICILLIN 875 MG PO TABS: 875.0000 mg | ORAL_TABLET | Freq: Two times a day (BID) | ORAL | 0 refills | Status: AC

## 2019-08-19 NOTE — Discharge Instructions (Signed)
Take the antibiotic as directed.  Additionally you can take over-the-counter plain Mucinex for your nasal congestion; and ibuprofen or Tylenol for fever or discomfort.    Follow up with your primary care provider if your symptoms are not improving.

## 2019-08-19 NOTE — ED Provider Notes (Signed)
Sandy [name]    CSN: [id number] Arrival date & time: 08/19/19  1637      History   Chief Complaint Chief Complaint  Patient presents with  . Cough    HPI Sandy [name] is a 44 y.o. female.   Patient presents with a 2-week history of sore throat, cough productive of green-yellow phlegm, fatigue, body aches, nasal congestion.  She reports a negative Covid test done on 08/16/2019.  She denies fever, chills, difficulty swallowing, shortness of breath, vomiting, diarrhea, rash, or other symptoms.  Treatment attempted at home with Tylenol.    The history is provided by the patient.    Past Medical History:  Diagnosis Date  . Fracture of ankle    RT  . Numbness    OCASIONAL NUMBNESS RT LEG    Patient Active Problem List   Diagnosis Date Noted  . Morbid obesity ([place]) 02/04/2014  . Ankle fracture, right 12/17/2013  . Closed right ankle fracture 12/17/2013    Past Surgical History:  Procedure Laterality Date  . BREAST SURGERY     right benign tumor  . BREATH TEK H PYLORI N/A 03/15/2014   Procedure: BREATH TEK H PYLORI;  Surgeon: Pedro Earls, MD;  Location: [facility];  Service: Endoscopy;  Laterality: N/A;  . CHOLECYSTECTOMY    . DILATATION & CURRETTAGE/HYSTEROSCOPY WITH RESECTOCOPE N/A 03/03/2014   Procedure: DILATATION & CURETTAGE/HYSTEROSCOPY WITH RESECTOSCOPE;  Surgeon: Marvene Staff, MD;  Location: [facility];  Service: Gynecology;  Laterality: N/A;  . ESOPHAGOGASTRODUODENOSCOPY N/A 03/29/2014   Procedure: ESOPHAGOGASTRODUODENOSCOPY (EGD)/UPPER ENDOSCOPY;  Surgeon: Shann Medal, MD;  Location: [facility];  Service: General;  Laterality: N/A;  . FRACTURE SURGERY     tiblia ,fibula  . ORIF ANKLE FRACTURE Right 12/17/2013   Procedure: OPEN REDUCTION INTERNAL FIXATION (ORIF) RIGHT  ANKLE FRACTURE WITH SYNDESMOSIS SENSATION;  Surgeon: Johnn Hai, MD;  Location: [facility];  Service: Orthopedics;  Laterality: Right;    OB History   No obstetric  history on file.      Home Medications    Prior to Admission medications   Medication Sig Start Date End Date Taking? Authorizing Provider  ALPRAZolam Duanne Moron) 0.5 MG tablet Take 0.5 mg by mouth 3 (three) times daily as needed. 07/04/19  Yes [provider]  buPROPion (WELLBUTRIN XL) 150 MG 24 hr tablet Take 150 mg by mouth daily. 08/15/19  Yes [provider]  amoxicillin (AMOXIL) 875 MG tablet Take 1 tablet (875 mg total) by mouth 2 (two) times daily for 10 days. 08/19/19 08/29/19  Sharion Balloon, NP  ibuprofen (ADVIL,MOTRIN) 800 MG tablet Take 800 mg by mouth every 8 (eight) hours as needed for mild pain or moderate pain.    [provider]  oxyCODONE-acetaminophen (PERCOCET) 7.5-325 MG per tablet Take 1 tablet by mouth every 4 (four) hours as needed for pain.    [provider]  traMADol (ULTRAM) 50 MG tablet Take 1 tablet (50 mg total) by mouth every 6 (six) hours as needed. 07/24/14   Veryl Speak, MD    Family History Family History  Problem Relation Age of Onset  . Healthy Mother   . Healthy Father     Social History Social History   Tobacco Use  . Smoking status: Never Smoker  . Smokeless tobacco: Never Used  Substance Use Topics  . Alcohol use: Yes    Comment: OCCASIONAL  . Drug use: No     Allergies   Percocet [oxycodone-acetaminophen]  Review of Systems Review of Systems  Constitutional: Positive for fatigue. Negative for chills and fever.  HENT: Positive for congestion and sore throat. Negative for ear pain and trouble swallowing.   Eyes: Negative for pain and visual disturbance.  Respiratory: Positive for cough. Negative for shortness of breath.   Cardiovascular: Negative for chest pain and palpitations.  Gastrointestinal: Negative for abdominal pain, diarrhea, nausea and vomiting.  Genitourinary: Negative for dysuria and hematuria.  Musculoskeletal: Negative for arthralgias and back pain.  Skin: Negative for color  change and rash.  Neurological: Negative for seizures and syncope.  All other systems reviewed and are negative.    Physical Exam Triage Vital Signs ED Triage Vitals  Enc Vitals Group     BP      Pulse      Resp      Temp      Temp src      SpO2      Weight      Height      Head Circumference      Peak Flow      Pain Score      Pain Loc      Pain Edu?      Excl. in [place]?    No data found.  Updated Vital Signs BP 126/85 (BP Location: Left Arm)   Pulse (!) 108   Temp 99.7 F (37.6 C) (Oral)   Resp 18   Ht 5\' 2"  (1.575 m)   Wt 246 lb (111.6 kg)   LMP 08/10/2019   SpO2 98%   BMI 44.99 kg/m   Visual Acuity Right Eye Distance:   Left Eye Distance:   Bilateral Distance:    Right Eye Near:   Left Eye Near:    Bilateral Near:     Physical Exam Vitals and nursing note reviewed.  Constitutional:      General: She is not in acute distress.    Appearance: She is well-developed. She is not ill-appearing.  HENT:     Head: Normocephalic and atraumatic.     Right Ear: Tympanic membrane normal.     Left Ear: Tympanic membrane normal.     Nose: Nose normal.     Mouth/Throat:     Mouth: Mucous membranes are moist.     Pharynx: Posterior oropharyngeal erythema present. No oropharyngeal exudate.  Eyes:     Conjunctiva/sclera: Conjunctivae normal.  Cardiovascular:     Rate and Rhythm: Normal rate and regular rhythm.     Heart sounds: No murmur.  Pulmonary:     Effort: Pulmonary effort is normal. No respiratory distress.     Breath sounds: Normal breath sounds.  Abdominal:     General: Bowel sounds are normal.     Palpations: Abdomen is soft.     Tenderness: There is no abdominal tenderness. There is no guarding or rebound.  Musculoskeletal:     Cervical back: Neck supple.  Skin:    General: Skin is warm and dry.     Findings: No rash.  Neurological:     General: No focal deficit present.     Mental Status: She is alert and oriented to person, place, and time.    Psychiatric:        Mood and Affect: Mood normal.        Behavior: Behavior normal.      UC Treatments / Results  Labs (all labs ordered are listed, but only abnormal results are displayed) Labs Reviewed  CULTURE, GROUP  A STREP [facility])  POCT RAPID STREP A (OFFICE)    EKG   Radiology No results found.  Procedures Procedures (including critical care time)  Medications Ordered in UC Medications - No data to display  Initial Impression / Assessment and Plan / UC Course  I have reviewed the triage vital signs and the nursing notes.  Pertinent labs & imaging results that were available during my care of the patient were reviewed by me and considered in my medical decision making (see chart for details).   Acute sinusitis, sore throat.  Treating with amoxicillin.  Instructed patient to take OTC Mucinex as needed for congestion and Tylenol or ibuprofen as needed for her fever or discomfort.  Instructed her to follow-up with her PCP if her symptoms are not improving.  Patient agrees to plan of care.  Final Clinical Impressions(s) / UC Diagnoses   Final diagnoses:  Acute non-recurrent maxillary sinusitis  Sore throat     Discharge Instructions     Take the antibiotic as directed.  Additionally you can take over-the-counter plain Mucinex for your nasal congestion; and ibuprofen or Tylenol for fever or discomfort.    Follow up with your primary care provider if your symptoms are not improving.        ED Prescriptions    Medication Sig Dispense Auth. Provider   amoxicillin (AMOXIL) 875 MG tablet Take 1 tablet (875 mg total) by mouth 2 (two) times daily for 10 days. 20 tablet Sharion Balloon, NP     PDMP not reviewed this encounter.   Sharion Balloon, NP 08/19/19 [phone]

## 2019-08-19 NOTE — ED Triage Notes (Signed)
Patient complains of cough, congestion with productivity, sore throat, fatigue and body aches. Patient states that she was Covid tested on Sunday and got the negative results yesterday. Patient states that her symptoms have been ongoing for 2 weeks.

## 2019-08-22 LAB — CULTURE, GROUP A STREP (THRC)

## 2019-08-27 ENCOUNTER — Ambulatory Visit: Payer: Self-pay | Admitting: Adult Health

## 2019-08-31 ENCOUNTER — Ambulatory Visit: Payer: 59 | Admitting: Adult Health

## 2019-09-08 ENCOUNTER — Encounter (INDEPENDENT_AMBULATORY_CARE_PROVIDER_SITE_OTHER): Payer: Self-pay | Admitting: Family Medicine

## 2019-09-08 ENCOUNTER — Ambulatory Visit (INDEPENDENT_AMBULATORY_CARE_PROVIDER_SITE_OTHER): Payer: 59 | Admitting: Family Medicine

## 2019-09-08 ENCOUNTER — Other Ambulatory Visit: Payer: Self-pay

## 2019-09-08 VITALS — BP 118/78 | HR 66 | Temp 97.8°F | Ht 62.0 in | Wt 240.0 lb

## 2019-09-08 DIAGNOSIS — R5383 Other fatigue: Secondary | ICD-10-CM | POA: Diagnosis not present

## 2019-09-08 DIAGNOSIS — R739 Hyperglycemia, unspecified: Secondary | ICD-10-CM

## 2019-09-08 DIAGNOSIS — F418 Other specified anxiety disorders: Secondary | ICD-10-CM

## 2019-09-08 DIAGNOSIS — Z0289 Encounter for other administrative examinations: Secondary | ICD-10-CM

## 2019-09-08 DIAGNOSIS — E7849 Other hyperlipidemia: Secondary | ICD-10-CM

## 2019-09-08 DIAGNOSIS — E559 Vitamin D deficiency, unspecified: Secondary | ICD-10-CM | POA: Diagnosis not present

## 2019-09-08 DIAGNOSIS — Z6841 Body Mass Index (BMI) 40.0 and over, adult: Secondary | ICD-10-CM

## 2019-09-08 DIAGNOSIS — R0602 Shortness of breath: Secondary | ICD-10-CM

## 2019-09-08 DIAGNOSIS — Z9189 Other specified personal risk factors, not elsewhere classified: Secondary | ICD-10-CM
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
## 2019-09-08 NOTE — Progress Notes (Signed)
Dear Sandy Salina, MD,   Thank you for referring Sandy [name] to our clinic. The following note includes my evaluation and treatment recommendations.  Chief Complaint:   OBESITY Sandy [name] (MR# [id number]) is a 44 y.o. female who presents for evaluation and treatment of obesity and related comorbidities. Current BMI is Body mass index is 43.9 kg/m. Sandy [name] has been struggling with her weight for many years and has been unsuccessful in either losing weight, maintaining weight loss, or reaching her healthy weight goal.  Sandy [name] has a history of ETOH use, and she has stopped drinking ETOH 2 days ago. She is feeling well overall.  Sandy [name] is currently in the action stage of change and ready to dedicate time achieving and maintaining a healthier weight. Sandy [name] is interested in becoming our patient and working on intensive lifestyle modifications including (but not limited to) diet and exercise for weight loss.  Sandy [name]'s habits were reviewed today and are as follows: her desired weight loss is 95 lbs, she has been heavy most of her life, she started gaining weight in her mid 20's, her heaviest weight ever was 260 pounds, she has significant food cravings issues, she snacks frequently in the evenings, she wakes up frequently in the middle of the night to eat, she skips meals frequently, she is frequently drinking liquids with calories, she frequently makes poor food choices, she frequently eats larger portions than normal and she struggles with emotional eating.  Depression Screen Sandy [name]'s Food and Mood (modified PHQ-9) score was 8.  Depression screen PHQ 2/9 09/08/2019  Decreased Interest 1  Down, Depressed, Hopeless 1  PHQ - 2 Score 2  Altered sleeping 1  Tired, decreased energy 3  Change in appetite 1  Feeling bad or failure about yourself  0  Trouble concentrating 1  Moving slowly or fidgety/restless 0  Suicidal thoughts 0  PHQ-9 Score 8  Difficult doing  work/chores Not difficult at all   Subjective:   1. Other fatigue Sandy [name] admits to daytime somnolence and admits to waking up still tired. Patent has a history of symptoms of daytime fatigue. Sandy [name] generally gets 6 or 8 hours of sleep per night, and states that she has generally restful sleep. Snoring is present. Apneic episodes are present. Epworth Sleepiness Score is 9.  2. Shortness of breath on exertion Sandy [name] notes increasing shortness of breath with exercising and seems to be worsening over time with weight gain. She notes getting out of breath sooner with activity than she used to. This has not gotten worse recently. Sandy [name] denies shortness of breath at rest or orthopnea.  3. Other hyperlipidemia Sandy [name] has a history of elevated triglycerides in her records, and she is not on statin.  4. Vitamin D deficiency Sandy [name] has a history of Vit D deficiency in the past. She has no recent labs.  5. Hyperglycemia Sandy [name] has a history of some elevated glucose readings in the past.  6. Depression with anxiety Sandy [name] is on Wellbutrin and Xanax. She notes some emotional eating and drinking. She is not followed by a therapist at this time.  7. At risk for heart disease Sandy [name] is at a higher than average risk for cardiovascular disease due to obesity. Reviewed: no chest pain on exertion, no dyspnea on exertion, and no swelling of ankles.  Assessment/Plan:   1. Other fatigue Sandy [name] does feel that her weight is causing her energy to be lower than it should be. Fatigue may be related to obesity, depression or many other  causes. Labs will be ordered, and in the meanwhile, Sandy [name] will focus on self care including making healthy food choices, increasing physical activity and focusing on stress reduction.  - EKG 12-Lead - Vitamin B12 - CBC with Differential/Platelet - Folate - T3 - T4, free - TSH  2. Shortness of breath on exertion Sandy [name] does feel that she gets out of breath more  easily that she used to when she exercises. Sandy [name]'s shortness of breath appears to be obesity related and exercise induced. She has agreed to work on weight loss and gradually increase exercise to treat her exercise induced shortness of breath. Will continue to monitor closely.  3. Other hyperlipidemia Cardiovascular risk and specific lipid/LDL goals reviewed. We discussed several lifestyle modifications today and Sandy [name] will continue to work on diet, exercise and weight loss efforts. We will check labs today. Orders and follow up as documented in patient record.   Counseling Intensive lifestyle modifications are the first line treatment for this issue. . Dietary changes: Increase soluble fiber. Decrease simple carbohydrates. . Exercise changes: Moderate to vigorous-intensity aerobic activity 150 minutes per week if tolerated. . Lipid-lowering medications: see documented in medical record.  - Lipid Panel With LDL/HDL Ratio  4. Vitamin D deficiency Low Vitamin D level contributes to fatigue and are associated with obesity, breast, and colon cancer. We will check labs today. [name] will follow-up for routine testing of Vitamin D, at least 2-3 times per year to avoid over-replacement.  - VITAMIN D 25 Hydroxy (Vit-D Deficiency, Fractures)  5. Hyperglycemia Fasting labs will be obtained today and results with be discussed with Sandy [name] in 2 weeks at her follow up visit. In the meanwhile Sandy [name] was started on a lower simple carbohydrate diet and will work on weight loss efforts.  - Comprehensive metabolic panel - Hemoglobin A1c - Insulin, random  6. Depression with anxiety Behavior modification techniques were discussed today to help Sandy [name] deal with her emotional/non-hunger eating behaviors. We will refer to Dr. Mallie Mussel, our Bariatric Psychologist for evaluation. Orders and follow up as documented in patient record.   7. At risk for heart disease Sandy [name] was given approximately 30 minutes  of coronary artery disease prevention counseling today. She is 44 y.o. female and has risk factors for heart disease including obesity. We discussed intensive lifestyle modifications today with an emphasis on specific weight loss instructions and strategies.   Repetitive spaced learning was employed today to elicit superior memory formation and behavioral change.  8. Class 3 severe obesity with serious comorbidity and body mass index (BMI) of 40.0 to 44.9 in adult, unspecified obesity type (HCC) Sandy [name] is currently in the action stage of change and her goal is to continue with weight loss efforts. I recommend Sandy [name] begin the structured treatment plan as follows:  She has agreed to keeping a food journal and adhering to recommended goals of 1200-1500 calories and 85+ grams of protein daily.   Behavioral modification strategies: increasing lean protein intake and meal planning and cooking strategies.  She was informed of the importance of frequent follow-up visits to maximize her success with intensive lifestyle modifications for her multiple health conditions. She was informed we would discuss her lab results at her next visit unless there is a critical issue that needs to be addressed sooner. Sandy [name] agreed to keep her next visit at the agreed upon time to discuss these results.  Objective:   Blood pressure 118/78, pulse 66, temperature 97.8 F (36.6 C), temperature source Oral, height 5\' 2"  (1.575 m),  weight 240 lb (108.9 kg), last menstrual period 09/04/2019, SpO2 100 %. Body mass index is 43.9 kg/m.  EKG: Normal sinus rhythm, rate 70 BPM.  Indirect Calorimeter completed today shows a VO2 of 298 and a REE of 2073.  Her calculated basal metabolic rate is 0000000 thus her basal metabolic rate is better than expected.  General: Cooperative, alert, well developed, in no acute distress. HEENT: Conjunctivae and lids unremarkable. Cardiovascular: Regular rhythm.  Lungs: Normal work of  breathing. Neurologic: No focal deficits.   Lab Results  Component Value Date   CREATININE 0.65 02/04/2014   BUN 12 02/04/2014   NA 136 02/04/2014   K 4.2 02/04/2014   CL 102 02/04/2014   CO2 24 02/04/2014   Lab Results  Component Value Date   ALT 15 02/04/2014   AST 17 02/04/2014   ALKPHOS 55 02/04/2014   BILITOT 0.3 02/04/2014   Lab Results  Component Value Date   HGBA1C 5.7 (H) 02/04/2014   No results found for: INSULIN Lab Results  Component Value Date   TSH 1.778 02/04/2014   Lab Results  Component Value Date   CHOL 192 02/04/2014   HDL 54 02/04/2014   LDLCALC 90 02/04/2014   TRIG 240 (H) 02/04/2014   CHOLHDL 3.6 02/04/2014   Lab Results  Component Value Date   WBC 6.2 03/03/2014   HGB 12.7 03/03/2014   HCT 39.7 03/03/2014   MCV 73.4 (L) 03/03/2014   PLT 379 03/03/2014   Lab Results  Component Value Date   IRON 86 02/04/2014   TIBC 418 02/04/2014   Attestation Statements:   Reviewed by clinician on day of visit: allergies, medications, problem list, medical history, surgical history, family history, social history, and previous encounter notes.   I, Trixie Dredge, am acting as transcriptionist for Dennard Nip, MD.  I have reviewed the above documentation for accuracy and completeness, and I agree with the above. - Dennard Nip, MD

## 2019-09-10 LAB — COMPREHENSIVE METABOLIC PANEL
ALT: 32 IU/L (ref 0–32)
AST: 31 IU/L (ref 0–40)
Albumin/Globulin Ratio: 1.4 (ref 1.2–2.2)
Albumin: 4.5 g/dL (ref 3.8–4.8)
Alkaline Phosphatase: 60 IU/L (ref 39–117)
BUN/Creatinine Ratio: 8 — ABNORMAL LOW (ref 9–23)
BUN: 6 mg/dL (ref 6–24)
Bilirubin Total: 0.4 mg/dL (ref 0.0–1.2)
CO2: 21 mmol/L (ref 20–29)
Calcium: 10 mg/dL (ref 8.7–10.2)
Chloride: 100 mmol/L (ref 96–106)
Creatinine, Ser: 0.72 mg/dL (ref 0.57–1.00)
GFR calc Af Amer: 120 mL/min/{1.73_m2} (ref >59)
GFR calc non Af Amer: 104 mL/min/{1.73_m2} (ref >59)
Globulin, Total: 3.2 g/dL (ref 1.5–4.5)
Glucose: 87 mg/dL (ref 65–99)
Potassium: 4 mmol/L (ref 3.5–5.2)
Sodium: 138 mmol/L (ref 134–144)
Total Protein: 7.7 g/dL (ref 6.0–8.5)

## 2019-09-10 LAB — LIPID PANEL WITH LDL/HDL RATIO
Cholesterol, Total: 212 mg/dL — ABNORMAL HIGH (ref 100–199)
HDL: 69 mg/dL (ref >39)
LDL Chol Calc (NIH): 114 mg/dL — ABNORMAL HIGH (ref 0–99)
LDL/HDL Ratio: 1.7 ratio (ref 0.0–3.2)
Triglycerides: 167 mg/dL — ABNORMAL HIGH (ref 0–149)
VLDL Cholesterol Cal: 29 mg/dL (ref 5–40)

## 2019-09-10 LAB — CBC WITH DIFFERENTIAL/PLATELET
Basophils Absolute: 0 10*3/uL (ref 0.0–0.2)
Basos: 0 %
EOS (ABSOLUTE): 0.1 10*3/uL (ref 0.0–0.4)
Eos: 1 %
Hematocrit: 40.3 % (ref 34.0–46.6)
Hemoglobin: 12.7 g/dL (ref 11.1–15.9)
Immature Grans (Abs): 0 10*3/uL (ref 0.0–0.1)
Immature Granulocytes: 0 %
Lymphocytes Absolute: 2.6 10*3/uL (ref 0.7–3.1)
Lymphs: 34 %
MCH: 24 pg — ABNORMAL LOW (ref 26.6–33.0)
MCHC: 31.5 g/dL (ref 31.5–35.7)
MCV: 76 fL — ABNORMAL LOW (ref 79–97)
Monocytes Absolute: 0.5 10*3/uL (ref 0.1–0.9)
Monocytes: 6 %
Neutrophils Absolute: 4.5 10*3/uL (ref 1.4–7.0)
Neutrophils: 59 %
Platelets: 350 10*3/uL (ref 150–450)
RBC: 5.3 x10E6/uL — ABNORMAL HIGH (ref 3.77–5.28)
RDW: 14.8 % (ref 11.7–15.4)
WBC: 7.7 10*3/uL (ref 3.4–10.8)

## 2019-09-10 LAB — VITAMIN D 25 HYDROXY (VIT D DEFICIENCY, FRACTURES): Vit D, 25-Hydroxy: 10.6 ng/mL — ABNORMAL LOW (ref 30.0–100.0)

## 2019-09-10 LAB — FOLATE: Folate: 9.3 ng/mL (ref >3.0)

## 2019-09-10 LAB — TSH: TSH: 2.13 u[IU]/mL (ref 0.450–4.500)

## 2019-09-10 LAB — T3: T3, Total: 98 ng/dL (ref 71–180)

## 2019-09-10 LAB — VITAMIN B12: Vitamin B-12: 817 pg/mL (ref 232–1245)

## 2019-09-10 LAB — HEMOGLOBIN A1C
Est. average glucose Bld gHb Est-mCnc: 123 mg/dL
Hgb A1c MFr Bld: 5.9 % — ABNORMAL HIGH (ref 4.8–5.6)

## 2019-09-10 LAB — INSULIN, RANDOM: INSULIN: 20.8 u[IU]/mL (ref 2.6–24.9)

## 2019-09-10 LAB — T4, FREE: Free T4: 1.22 ng/dL (ref 0.82–1.77)

## 2019-09-15 ENCOUNTER — Ambulatory Visit (INDEPENDENT_AMBULATORY_CARE_PROVIDER_SITE_OTHER): Payer: 59 | Admitting: Psychology

## 2019-09-15 ENCOUNTER — Telehealth (INDEPENDENT_AMBULATORY_CARE_PROVIDER_SITE_OTHER): Payer: Self-pay | Admitting: Psychology

## 2019-09-15 ENCOUNTER — Encounter (INDEPENDENT_AMBULATORY_CARE_PROVIDER_SITE_OTHER): Payer: Self-pay

## 2019-09-15 NOTE — Telephone Encounter (Signed)
  Office: [phone]  /  Fax: [phone]  Date of Call: September 15, 2019  Time of Call: 11:02am Provider: Glennie Isle, PsyD  CONTENT:  This provider called Sandy [name] to check-in as she did not present for today's Webex appointment at 11:00am. A HIPAA compliant voicemail was left requesting a call back. Of note, this provider stayed on the WebEx appointment for 5 minutes prior to signing off per the clinic's grace period policy.    PLAN: This provider will wait for [name] to call back. No further follow-up planned by this provider.

## 2019-09-22 ENCOUNTER — Ambulatory Visit (INDEPENDENT_AMBULATORY_CARE_PROVIDER_SITE_OTHER): Payer: 59 | Admitting: Family Medicine

## 2019-09-22 ENCOUNTER — Encounter (INDEPENDENT_AMBULATORY_CARE_PROVIDER_SITE_OTHER): Payer: Self-pay | Admitting: Family Medicine

## 2019-09-22 ENCOUNTER — Other Ambulatory Visit: Payer: Self-pay

## 2019-09-22 VITALS — BP 123/78 | HR 97 | Temp 98.7°F | Ht 62.0 in | Wt 232.0 lb

## 2019-09-22 DIAGNOSIS — E782 Mixed hyperlipidemia: Secondary | ICD-10-CM

## 2019-09-22 DIAGNOSIS — E559 Vitamin D deficiency, unspecified: Secondary | ICD-10-CM | POA: Diagnosis not present

## 2019-09-22 DIAGNOSIS — Z9189 Other specified personal risk factors, not elsewhere classified: Secondary | ICD-10-CM | POA: Diagnosis not present

## 2019-09-22 DIAGNOSIS — Z6841 Body Mass Index (BMI) 40.0 and over, adult: Secondary | ICD-10-CM

## 2019-09-22 DIAGNOSIS — R7303 Prediabetes: Secondary | ICD-10-CM

## 2019-09-22 MED ORDER — VITAMIN D (ERGOCALCIFEROL) 1.25 MG (50000 UNIT) PO CAPS: 50000.0000 [IU] | ORAL_CAPSULE | ORAL | 0 refills | Status: DC

## 2019-09-23 NOTE — Progress Notes (Signed)
Chief Complaint:   OBESITY Sandy [name] is here to discuss her progress with her obesity treatment plan along with follow-up of her obesity related diagnoses. Sandy [name] is on keeping a food journal and adhering to recommended goals of 1200-1500 calories and 85+ grams of protein daily and states she is following her eating plan approximately 90% of the time. Sandy [name] states she is doing 0 minutes 0 times per week.  Today's visit was #: 2 Starting weight: 240 lbs Starting date: 09/08/2019 Today's weight: 232 lbs Today's date: 09/22/2019 Total lbs lost to date: 8 Total lbs lost since last in-office visit: 8  Interim History: Sandy [name] has done very well with weight loss on her journaling plan. She feels she did better over time meeting her protein goals. Her hunger was mostly controlled. She did well avoiding ETOH.  Subjective:   1. Mixed hyperlipidemia Sandy [name] has a new diagnosis of hyperlipidemia. Her triglycerides and LDL are elevated. I discussed labs with the patient today.  2. Vitamin D deficiency Sandy [name]'s Vit D level is very low. She notes fatigue. I discussed labs with the patient today.  3. Pre-diabetes Sandy [name] has a new diagnosis of pre-diabetes. Her A1c and insulin are elevated. She is not on metformin, and she has done well with her diet prescription. I discussed labs with the patient today.  4. At risk for diabetes mellitus Sandy [name] is at higher than average risk for developing diabetes due to her obesity and prediabetes   Assessment/Plan:   1. Mixed hyperlipidemia Cardiovascular risk and specific lipid/LDL goals reviewed. We discussed several lifestyle modifications today and Sandy [name] will continue to work on diet, exercise and weight loss efforts. We will recheck labs in 3 months. Orders and follow up as documented in patient record.   2. Vitamin D deficiency Low Vitamin D level contributes to fatigue and are associated with obesity, breast, and colon cancer. Sandy [name] agreed  to start prescription Vitamin D 50,000 IU every week with no refill. She will follow-up for routine testing of Vitamin D, at least 2-3 times per year to avoid over-replacement.  - Vitamin D, Ergocalciferol, (DRISDOL) 1.25 MG (50000 UNIT) CAPS capsule; Take 1 capsule (50,000 Units total) by mouth every 7 (seven) days.  Dispense: 4 capsule; Refill: 0  3. Pre-diabetes We will hold metformin for now. Sandy [name] will continue to work on weight loss, diet, exercise, and decreasing simple carbohydrates to help decrease the risk of diabetes. We will recheck labs in 3 months.  4. At risk for diabetes mellitus Sandy [name] was given approximately 30 minutes of diabetes prevention education and counseling today. We discussed intensive lifestyle modifications today with an emphasis on weight loss as well as increasing exercise and decreasing simple carbohydrates in her diet. We also reviewed medication options with an emphasis on risk versus benefit of those discussed.   Repetitive spaced learning was employed today to elicit superior memory formation and behavioral change.   5. Class 3 severe obesity with serious comorbidity and body mass index (BMI) of 40.0 to 44.9 in adult, unspecified obesity type (HCC) Sandy [name] is currently in the action stage of change. As such, her goal is to continue with weight loss efforts. She has agreed to keeping a food journal and adhering to recommended goals of 1200-1500 calories and 85+ grams of protein daily.   Sandy [name] were given today.  Behavioral modification strategies: increasing lean protein intake and meal planning and cooking strategies.  Sandy [name] has agreed to follow-up with our clinic in 2 weeks.  She was informed of the importance of frequent follow-up visits to maximize her success with intensive lifestyle modifications for her multiple health conditions.   Objective:   Blood pressure 123/78, pulse 97, temperature 98.7 F (37.1 C), temperature source  Oral, height 5\' 2"  (1.575 m), weight 232 lb (105.2 kg), last menstrual period 09/04/2019, SpO2 95 %. Body mass index is 42.43 kg/m.  General: Cooperative, alert, well developed, in no acute distress. HEENT: Conjunctivae and lids unremarkable. Cardiovascular: Regular rhythm.  Lungs: Normal work of breathing. Neurologic: No focal deficits.   Lab Results  Component Value Date   CREATININE 0.72 09/09/2019   BUN 6 09/09/2019   NA 138 09/09/2019   K 4.0 09/09/2019   CL 100 09/09/2019   CO2 21 09/09/2019   Lab Results  Component Value Date   ALT 32 09/09/2019   AST 31 09/09/2019   ALKPHOS 60 09/09/2019   BILITOT 0.4 09/09/2019   Lab Results  Component Value Date   HGBA1C 5.9 (H) 09/09/2019   HGBA1C 5.7 (H) 02/04/2014   Lab Results  Component Value Date   INSULIN 20.8 09/09/2019   Lab Results  Component Value Date   TSH 2.130 09/09/2019   Lab Results  Component Value Date   CHOL 212 (H) 09/09/2019   HDL 69 09/09/2019   LDLCALC 114 (H) 09/09/2019   TRIG 167 (H) 09/09/2019   CHOLHDL 3.6 02/04/2014   Lab Results  Component Value Date   WBC 7.7 09/09/2019   HGB 12.7 09/09/2019   HCT 40.3 09/09/2019   MCV 76 (L) 09/09/2019   PLT 350 09/09/2019   Lab Results  Component Value Date   IRON 86 02/04/2014   TIBC 418 02/04/2014   Attestation Statements:   Reviewed by clinician on day of visit: allergies, medications, problem list, medical history, surgical history, family history, social history, and previous encounter notes.   I, Trixie Dredge, am acting as transcriptionist for Dennard Nip, MD.  I have reviewed the above documentation for accuracy and completeness, and I agree with the above. -  Dennard Nip, MD

## 2019-09-29 ENCOUNTER — Ambulatory Visit: Payer: 59 | Admitting: Adult Health

## 2019-10-05 ENCOUNTER — Other Ambulatory Visit: Payer: Self-pay

## 2019-10-05 ENCOUNTER — Encounter: Payer: Self-pay | Admitting: Adult Health

## 2019-10-05 ENCOUNTER — Ambulatory Visit (INDEPENDENT_AMBULATORY_CARE_PROVIDER_SITE_OTHER): Payer: 59 | Admitting: Adult Health

## 2019-10-05 VITALS — BP 148/96 | HR 90 | Ht 62.0 in | Wt 230.0 lb

## 2019-10-05 DIAGNOSIS — F331 Major depressive disorder, recurrent, moderate: Secondary | ICD-10-CM | POA: Diagnosis not present

## 2019-10-05 DIAGNOSIS — F1010 Alcohol abuse, uncomplicated: Secondary | ICD-10-CM

## 2019-10-05 DIAGNOSIS — F411 Generalized anxiety disorder: Secondary | ICD-10-CM | POA: Diagnosis not present

## 2019-10-05 MED ORDER — ALPRAZOLAM 0.5 MG PO TABS: 0.5000 mg | ORAL_TABLET | Freq: Three times a day (TID) | ORAL | 2 refills | Status: DC | PRN

## 2019-10-05 MED ORDER — BUPROPION HCL ER (XL) 150 MG PO TB24: 150.0000 mg | ORAL_TABLET | Freq: Every day | ORAL | 1 refills | Status: DC

## 2019-10-05 NOTE — Progress Notes (Signed)
[facility] MD/PA/NP Initial Note  10/05/2019 11:58 AM Sandy [name]  MRN:  [id number]  Chief Complaint:   HPI:   Describes mood today as "ok". Pleasant. Tearful at times. Mood symptoms - reports depression, anxiety, and irritability. Irritability has subsided since stopping alcohol use a month ago. Depression improved with Wellbutrin - started 7 months ago. Stating "I feel more anxious than anything". Gets things "stuck in her head from family to work to relationships". Feels like she "ruminates" - "I can be in my thoughts". Feels more anxious overall. Mother recently hospitalized with a collapsed lung and felt like "she struggled" while she was hospitalized. Stating "she's all I have". Stable interest and motivation. Taking medications as prescribed and feels they are working well for her.  Energy levels "great". Recently started on Vitamin D - 50,000 a week for next 12 weeks. Active, does not have a regular exercise routine. Works full-time - 40 hours a week.  Enjoys some usual interests and activities. Single. Dating. No children. No pets. Mother in [place]. Extended family local.  Appetite adequate. Weight loss 10 pounds - seeing a weight manager with [facility]. Sleeps well most nights. Averages 6 to 8 hours. Feels rested. Focus and concentration "sometimes". Completing tasks. Managing aspects of household - "neat and clean". Working at home for [employer] for past 8 to 9 years.   Denies SI or HI. Denies AH or VH. Quit alcohol use a month ago - went through withdrawal - "cranky and having headaches". Has been "drinking" since she was 19.   Previous medication trials: Lexapro  Visit Diagnosis:    ICD-10-CM   1. Generalized anxiety disorder  F41.1 ALPRAZolam (XANAX) 0.5 MG tablet    buPROPion (WELLBUTRIN XL) 150 MG 24 hr tablet  2. Major depressive disorder, recurrent episode, moderate (HCC)  F33.1 buPROPion (WELLBUTRIN XL) 150 MG 24 hr tablet  3. Alcohol abuse  F10.10     Past  Psychiatric History: Denies psychiatric hospitalization.  Past Medical History:  Past Medical History:  Diagnosis Date  . Alcohol abuse   . Anxiety   . Depression   . Fracture of ankle    RT  . Numbness    OCASIONAL NUMBNESS RT LEG    Past Surgical History:  Procedure Laterality Date  . BREAST SURGERY     right benign tumor  . BREATH TEK H PYLORI N/A 03/15/2014   Procedure: BREATH TEK H PYLORI;  Surgeon: Pedro Earls, MD;  Location: [facility];  Service: Endoscopy;  Laterality: N/A;  . CHOLECYSTECTOMY    . DILATATION & CURRETTAGE/HYSTEROSCOPY WITH RESECTOCOPE N/A 03/03/2014   Procedure: DILATATION & CURETTAGE/HYSTEROSCOPY WITH RESECTOSCOPE;  Surgeon: Marvene Staff, MD;  Location: [facility];  Service: Gynecology;  Laterality: N/A;  . ESOPHAGOGASTRODUODENOSCOPY N/A 03/29/2014   Procedure: ESOPHAGOGASTRODUODENOSCOPY (EGD)/UPPER ENDOSCOPY;  Surgeon: Shann Medal, MD;  Location: [facility];  Service: General;  Laterality: N/A;  . FRACTURE SURGERY     tiblia ,fibula  . ORIF ANKLE FRACTURE Right 12/17/2013   Procedure: OPEN REDUCTION INTERNAL FIXATION (ORIF) RIGHT  ANKLE FRACTURE WITH SYNDESMOSIS SENSATION;  Surgeon: Johnn Hai, MD;  Location: [facility];  Service: Orthopedics;  Laterality: Right;    Family Psychiatric History: Denies any family history of mental illness.  Family History:  Family History  Problem Relation Age of Onset  . Healthy Mother   . Healthy Father     Social History:  Social History   Socioeconomic History  . Marital status: Single    Spouse name:  Not on file  . Number of children: Not on file  . Years of education: Not on file  . Highest education level: Not on file  Occupational History  . Occupation: Conservation officer, nature  Tobacco Use  . Smoking status: Former Smoker    Packs/day: 0.50    Years: 12.00    Pack years: 6.00    Quit date: 07/30/2005    Years since quitting: 14.1  . Smokeless tobacco: Never Used  Substance and Sexual  Activity  . Alcohol use: Yes    Comment: OCCASIONAL  . Drug use: No  . Sexual activity: Yes    Birth control/protection: None  Other Topics Concern  . Not on file  Social History Narrative  . Not on file   Social Determinants of Health   Financial Resource Strain:   . Difficulty of Paying Living Expenses: Not on file  Food Insecurity:   . Worried About Charity fundraiser in the Last Year: Not on file  . Ran Out of Food in the Last Year: Not on file  Transportation Needs:   . Lack of Transportation (Medical): Not on file  . Lack of Transportation (Non-Medical): Not on file  Physical Activity:   . Days of Exercise per Week: Not on file  . Minutes of Exercise per Session: Not on file  Stress:   . Feeling of Stress : Not on file  Social Connections:   . Frequency of Communication with Friends and Family: Not on file  . Frequency of Social Gatherings with Friends and Family: Not on file  . Attends Religious Services: Not on file  . Active Member of Clubs or Organizations: Not on file  . Attends Archivist Meetings: Not on file  . Marital Status: Not on file    Allergies:  Allergies  Allergen Reactions  . Percocet [Oxycodone-Acetaminophen] Itching    Patient states that she still takes this medication even with itching.    Metabolic Disorder Labs: Lab Results  Component Value Date   HGBA1C 5.9 (H) 09/09/2019   MPG 117 (H) 02/04/2014   No results found for: PROLACTIN Lab Results  Component Value Date   CHOL 212 (H) 09/09/2019   TRIG 167 (H) 09/09/2019   HDL 69 09/09/2019   CHOLHDL 3.6 02/04/2014   VLDL 48 (H) 02/04/2014   LDLCALC 114 (H) 09/09/2019   LDLCALC 90 02/04/2014   Lab Results  Component Value Date   TSH 2.130 09/09/2019   TSH 1.778 02/04/2014    Therapeutic Level Labs: No results found for: LITHIUM No results found for: VALPROATE No components found for:  CBMZ  Current Medications: Current Outpatient Medications  Medication Sig  Dispense Refill  . ALPRAZolam (XANAX) 0.5 MG tablet Take 1 tablet (0.5 mg total) by mouth 3 (three) times daily as needed. 90 tablet 2  . buPROPion (WELLBUTRIN XL) 150 MG 24 hr tablet Take 1 tablet (150 mg total) by mouth daily. 90 tablet 1  . Vitamin D, Ergocalciferol, (DRISDOL) 1.25 MG (50000 UNIT) CAPS capsule Take 1 capsule (50,000 Units total) by mouth every 7 (seven) days. 4 capsule 0   No current facility-administered medications for this visit.    Medication Side Effects: none  Orders placed this visit:  No orders of the defined types were placed in this encounter.   Psychiatric Specialty Exam:  Review of Systems  Blood pressure (!) 148/96, pulse 90, height 5\' 2"  (1.575 m), weight 230 lb (104.3 kg).Body mass index is 42.07 kg/m.  General Appearance: Neat and Well Groomed  Eye Contact:  Good  Speech:  Clear and Coherent and Normal Rate  Volume:  Normal  Mood:  Anxious, Depressed and Irritable  Affect:  Appropriate and Congruent  Thought Process:  Coherent and Goal Directed  Orientation:  Full (Time, Place, and Person)  Thought Content: Logical   Suicidal Thoughts:  No  Homicidal Thoughts:  No  Memory:  WNL  Judgement:  Good  Insight:  Good  Psychomotor Activity:  Normal  Concentration:  Concentration: Good  Recall:  Good  Fund of Knowledge: Good  Language: Good  Assets:  Communication Skills Desire for Improvement Financial Resources/Insurance Housing Intimacy Leisure Time Physical Health Resilience Social Support Talents/Skills Transportation Vocational/Educational  ADL's:  Intact  Cognition: WNL  Prognosis:  Good   Screenings:  PHQ2-9     Office Visit from 09/08/2019 in [facility]  PHQ-2 Total Score  2  0  PHQ-9 Total Score  8  -      Receiving Psychotherapy: Yes - Monique Crutchfield.  Treatment Plan/Recommendations:   Plan:  PDMP reviewed  1.  Wellbutrin XL 300mg  daily 2. Xanax 0.5mg  TID  RTC 4 weeks  Patient advised to contact office with any questions, adverse effects, or acute worsening in signs and symptoms.  Discussed potential benefits, risk, and side effects of benzodiazepines to include potential risk of tolerance and dependence, as well as possible drowsiness.  Advised patient not to drive if experiencing drowsiness and to take lowest possible effective dose to minimize risk of dependence and tolerance.  Aloha Gell, NP

## 2019-10-06 ENCOUNTER — Ambulatory Visit (INDEPENDENT_AMBULATORY_CARE_PROVIDER_SITE_OTHER): Payer: 59 | Admitting: Physician Assistant

## 2019-10-06 ENCOUNTER — Encounter (INDEPENDENT_AMBULATORY_CARE_PROVIDER_SITE_OTHER): Payer: Self-pay | Admitting: Physician Assistant

## 2019-10-06 ENCOUNTER — Other Ambulatory Visit: Payer: Self-pay

## 2019-10-06 VITALS — BP 115/76 | HR 81 | Temp 98.0°F | Ht 62.0 in | Wt 229.0 lb

## 2019-10-06 DIAGNOSIS — Z9189 Other specified personal risk factors, not elsewhere classified: Secondary | ICD-10-CM

## 2019-10-06 DIAGNOSIS — E559 Vitamin D deficiency, unspecified: Secondary | ICD-10-CM

## 2019-10-06 DIAGNOSIS — E7849 Other hyperlipidemia: Secondary | ICD-10-CM | POA: Diagnosis not present

## 2019-10-06 DIAGNOSIS — Z6841 Body Mass Index (BMI) 40.0 and over, adult: Secondary | ICD-10-CM

## 2019-10-06 MED ORDER — VITAMIN D (ERGOCALCIFEROL) 1.25 MG (50000 UNIT) PO CAPS: 50000.0000 [IU] | ORAL_CAPSULE | ORAL | 0 refills | Status: DC

## 2019-10-06 NOTE — Progress Notes (Signed)
Chief Complaint:   Sandy [name] is here to discuss her progress with her obesity treatment plan along with follow-up of her obesity related diagnoses. Sandy [name] is keeping a food journal and adhering to recommended goals of 1500 calories and 85 grams of protein and states she is following her eating plan approximately 95% of the time. Sandy [name] states she is exercising 0 minutes 0 times per week.  Today's visit was #: 3 Starting weight: 240 lbs Starting date: 09/08/2019 Today's weight: 229 lbs Today's date: 10/06/2019 Total lbs lost to date: 11 Total lbs lost since last in-office visit: 3  Interim History: Sandy [name] reports that she sometimes skips meals, but overall has been meeting her calorie and protein goals. She wants to start exercising.  Subjective:   Vitamin D deficiency. Sandy [name] is on Vitamin D weekly. No nausea, vomiting, or muscle weakness. She is not walking outside currently. Last Vitamin D 10.6 on 09/09/2019.  Other hyperlipidemia. Last level was not at goal. She is on no medications. No chest pain.   Lab Results  Component Value Date   CHOL 212 (H) 09/09/2019   HDL 69 09/09/2019   LDLCALC 114 (H) 09/09/2019   TRIG 167 (H) 09/09/2019   CHOLHDL 3.6 02/04/2014   Lab Results  Component Value Date   ALT 32 09/09/2019   AST 31 09/09/2019   ALKPHOS 60 09/09/2019   BILITOT 0.4 09/09/2019   The 10-year ASCVD risk score Sandy [name]., et al., 2013) is: 0.2%   Values used to calculate the score:     Age: 44 years     Sex: Female     Is Non-Hispanic African American: Yes     Diabetic: No     Tobacco smoker: No     Systolic Blood Pressure: [id number] mmHg     Is BP treated: No     HDL Cholesterol: 69 mg/dL     Total Cholesterol: 212 mg/dL  At risk for osteoporosis. Sandy [name] is at higher risk of osteopenia and osteoporosis due to Vitamin D deficiency.   Assessment/Plan:   Vitamin D deficiency. Low Vitamin D level contributes to fatigue and are associated  with obesity, breast, and colon cancer. She was given a refill on her Vitamin D, Ergocalciferol, (DRISDOL) 1.25 MG (50000 UNIT) CAPS capsule every week #4 with 0 refills and will follow-up for routine testing of Vitamin D, at least 2-3 times per year to avoid over-replacement.    Other hyperlipidemia. Cardiovascular risk and specific lipid/LDL goals reviewed.  We discussed several lifestyle modifications today and Sandy [name] will continue to work on diet, start exercising, and weight loss efforts. Orders and follow up as documented in patient record.   Counseling Intensive lifestyle modifications are the first line treatment for this issue. . Dietary changes: Increase soluble fiber. Decrease simple carbohydrates. . Exercise changes: Moderate to vigorous-intensity aerobic activity 150 minutes per week if tolerated. Lipid-lowering medications: see documented in medical record.  At risk for osteoporosis. Sandy [name] was given approximately 15 minutes of osteoporosis prevention counseling today. Sandy [name] is at risk for osteopenia and osteoporosis due to her Vitamin D deficiency. She was encouraged to take her Vitamin D and follow her higher calcium diet and increase strengthening exercise to help strengthen her bones and decrease her risk of osteopenia and osteoporosis.  Repetitive spaced learning was employed today to elicit superior memory formation and behavioral change.  Class 3 severe obesity with serious comorbidity and body mass index (BMI) of 40.0 to 44.9 in adult,  unspecified obesity type ([place]).  Sandy [name] is currently in the action stage of change. As such, her goal is to continue with weight loss efforts. She has agreed to keeping a food journal and adhering to recommended goals of 1300-1500 calories and 85 grams of protein daily.   Exercise goals: For substantial health benefits, adults should do at least 150 minutes (2 hours and 30 minutes) a week of moderate-intensity, or 75 minutes (1 hour and 15  minutes) a week of vigorous-intensity aerobic physical activity, or an equivalent combination of moderate- and vigorous-intensity aerobic activity. Aerobic activity should be performed in episodes of at least 10 minutes, and preferably, it should be spread throughout the week.  Behavioral modification strategies: no skipping meals and meal planning and cooking strategies.  Sandy [name] has agreed to follow-up with our clinic in 2 weeks. She was informed of the importance of frequent follow-up visits to maximize her success with intensive lifestyle modifications for her multiple health conditions.   Objective:   Blood pressure 115/76, pulse 81, temperature 98 F (36.7 C), temperature source Oral, height 5\' 2"  (1.575 m), weight 229 lb (103.9 kg), SpO2 99 %. Body mass index is 41.88 kg/m.  General: Cooperative, alert, well developed, in no acute distress. HEENT: Conjunctivae and lids unremarkable. Cardiovascular: Regular rhythm.  Lungs: Normal work of breathing. Neurologic: No focal deficits.   Lab Results  Component Value Date   CREATININE 0.72 09/09/2019   BUN 6 09/09/2019   NA 138 09/09/2019   K 4.0 09/09/2019   CL 100 09/09/2019   CO2 21 09/09/2019   Lab Results  Component Value Date   ALT 32 09/09/2019   AST 31 09/09/2019   ALKPHOS 60 09/09/2019   BILITOT 0.4 09/09/2019   Lab Results  Component Value Date   HGBA1C 5.9 (H) 09/09/2019   HGBA1C 5.7 (H) 02/04/2014   Lab Results  Component Value Date   INSULIN 20.8 09/09/2019   Lab Results  Component Value Date   TSH 2.130 09/09/2019   Lab Results  Component Value Date   CHOL 212 (H) 09/09/2019   HDL 69 09/09/2019   LDLCALC 114 (H) 09/09/2019   TRIG 167 (H) 09/09/2019   CHOLHDL 3.6 02/04/2014   Lab Results  Component Value Date   WBC 7.7 09/09/2019   HGB 12.7 09/09/2019   HCT 40.3 09/09/2019   MCV 76 (L) 09/09/2019   PLT 350 09/09/2019   Lab Results  Component Value Date   IRON 86 02/04/2014   TIBC 418  02/04/2014   Attestation Statements:   Reviewed by clinician on day of visit: allergies, medications, problem list, medical history, surgical history, family history, social history, and previous encounter notes.  IMichaelene Song, am acting as transcriptionist for Abby Potash, PA-C   I have reviewed the above documentation for accuracy and completeness, and I agree with the above. Abby Potash, PA-C

## 2019-10-21 ENCOUNTER — Encounter (INDEPENDENT_AMBULATORY_CARE_PROVIDER_SITE_OTHER): Payer: Self-pay | Admitting: Physician Assistant

## 2019-10-21 ENCOUNTER — Ambulatory Visit (INDEPENDENT_AMBULATORY_CARE_PROVIDER_SITE_OTHER): Payer: 59 | Admitting: Physician Assistant

## 2019-10-21 ENCOUNTER — Other Ambulatory Visit: Payer: Self-pay

## 2019-10-21 VITALS — BP 120/82 | HR 88 | Temp 98.6°F | Ht 62.0 in | Wt 228.0 lb

## 2019-10-21 DIAGNOSIS — E7849 Other hyperlipidemia: Secondary | ICD-10-CM | POA: Diagnosis not present

## 2019-10-21 DIAGNOSIS — Z6841 Body Mass Index (BMI) 40.0 and over, adult: Secondary | ICD-10-CM | POA: Diagnosis not present

## 2019-10-21 NOTE — Progress Notes (Signed)
Chief Complaint:   Sandy [name] is here to discuss her progress with her obesity treatment plan along with follow-up of her obesity related diagnoses. Sandy [name] is keeping a food journal and adhering to recommended goals of 1500 calories and 85 grams of protein and states she is following her eating plan approximately 95% of the time. Sandy [name] states she is exercising 0 minutes 0 times per week.  Today's visit was #: 4 Starting weight: 240 lbs Starting date: 09/08/2019 Today's weight: 228 lbs Today's date: 10/21/2019 Total lbs lost to date: 12 Total lbs lost since last in-office visit: 1  Interim History: Sandy [name] states that she went over her calories almost daily the last 2 weeks. She was craving chocolate during that time. She is asking about eating plant-based.  Subjective:   Other hyperlipidemia. Sandy [name] is on no medications. No chest pain. She is not yet exercising.   Lab Results  Component Value Date   CHOL 212 (H) 09/09/2019   HDL 69 09/09/2019   LDLCALC 114 (H) 09/09/2019   TRIG 167 (H) 09/09/2019   CHOLHDL 3.6 02/04/2014   Lab Results  Component Value Date   ALT 32 09/09/2019   AST 31 09/09/2019   ALKPHOS 60 09/09/2019   BILITOT 0.4 09/09/2019   The 10-year ASCVD risk score Sandy Bussing DC Jr., Sandy al., Sandy [name]) is: 0.3%   Values used to calculate the score:     Age: 44 years     Sex: Female     Is Non-Hispanic African American: Yes     Diabetic: No     Tobacco smoker: No     Systolic Blood Pressure: 123456 mmHg     Is BP treated: No     HDL Cholesterol: 69 mg/dL     Total Cholesterol: 212 mg/dL  Assessment/Plan:   Other hyperlipidemia. Cardiovascular risk and specific lipid/LDL goals reviewed.  We discussed several lifestyle modifications today and Asata will continue to work on diet, exercise and weight loss efforts. Orders and follow up as documented in patient record.   Counseling Intensive lifestyle modifications are the first line treatment for  this issue. . Dietary changes: Increase soluble fiber. Decrease simple carbohydrates. . Exercise changes: Moderate to vigorous-intensity aerobic activity 150 minutes per week if tolerated. . Lipid-lowering medications: see documented in medical record.  Class 3 severe obesity with serious comorbidity and body mass index (BMI) of 40.0 to 44.9 in adult, unspecified obesity type ([place]).  Sandy [name] is currently in the action stage of change. As such, her goal is to continue with weight loss efforts. She has agreed to keeping a food journal and adhering to recommended goals of 1500 calories and 85 grams of protein daily.   Exercise goals: For substantial health benefits, adults should do at least 150 minutes (2 hours and 30 minutes) a week of moderate-intensity, or 75 minutes (1 hour and 15 minutes) a week of vigorous-intensity aerobic physical activity, or an equivalent combination of moderate- and vigorous-intensity aerobic activity. Aerobic activity should be performed in episodes of at least 10 minutes, and preferably, it should be spread throughout the week.  Behavioral modification strategies: meal planning and cooking strategies and keeping healthy foods in the home.  Sandy [name] has agreed to follow-up with our clinic in 2 weeks. She was informed of the importance of frequent follow-up visits to maximize her success with intensive lifestyle modifications for her multiple health conditions.   Objective:   Blood pressure 120/82, pulse 88, temperature 98.6 F (37 C),  temperature source Oral, height 5\' 2"  (1.575 m), weight 228 lb (103.4 kg), SpO2 100 %. Body mass index is 41.7 kg/m.  General: Cooperative, alert, well developed, in no acute distress. HEENT: Conjunctivae and lids unremarkable. Cardiovascular: Regular rhythm.  Lungs: Normal work of breathing. Neurologic: No focal deficits.   Lab Results  Component Value Date   CREATININE 0.72 09/09/2019   BUN 6 09/09/2019   NA 138 09/09/2019    K 4.0 09/09/2019   CL 100 09/09/2019   CO2 21 09/09/2019   Lab Results  Component Value Date   ALT 32 09/09/2019   AST 31 09/09/2019   ALKPHOS 60 09/09/2019   BILITOT 0.4 09/09/2019   Lab Results  Component Value Date   HGBA1C 5.9 (H) 09/09/2019   HGBA1C 5.7 (H) 02/04/2014   Lab Results  Component Value Date   INSULIN 20.8 09/09/2019   Lab Results  Component Value Date   TSH 2.130 09/09/2019   Lab Results  Component Value Date   CHOL 212 (H) 09/09/2019   HDL 69 09/09/2019   LDLCALC 114 (H) 09/09/2019   TRIG 167 (H) 09/09/2019   CHOLHDL 3.6 02/04/2014   Lab Results  Component Value Date   WBC 7.7 09/09/2019   HGB 12.7 09/09/2019   HCT 40.3 09/09/2019   MCV 76 (L) 09/09/2019   PLT 350 09/09/2019   Lab Results  Component Value Date   IRON 86 02/04/2014   TIBC 418 02/04/2014   Attestation Statements:   Reviewed by clinician on day of visit: allergies, medications, problem list, medical history, surgical history, family history, social history, and previous encounter notes.  Time spent on visit including pre-visit chart review and post-visit charting and care was 30 minutes.   Sandy [name], Sandy acting as transcriptionist for Sandy Potash, Sandy [name]   I have reviewed the above documentation for accuracy and completeness, and I agree with the above. Sandy Potash, Sandy [name]

## 2019-11-01 ENCOUNTER — Other Ambulatory Visit (INDEPENDENT_AMBULATORY_CARE_PROVIDER_SITE_OTHER): Payer: Self-pay | Admitting: Physician Assistant

## 2019-11-01 DIAGNOSIS — E559 Vitamin D deficiency, unspecified: Secondary | ICD-10-CM

## 2019-11-02 ENCOUNTER — Ambulatory Visit (INDEPENDENT_AMBULATORY_CARE_PROVIDER_SITE_OTHER): Payer: 59 | Admitting: Adult Health

## 2019-11-02 ENCOUNTER — Encounter: Payer: Self-pay | Admitting: Adult Health

## 2019-11-02 DIAGNOSIS — F410 Panic disorder [episodic paroxysmal anxiety] without agoraphobia: Secondary | ICD-10-CM

## 2019-11-02 DIAGNOSIS — F331 Major depressive disorder, recurrent, moderate: Secondary | ICD-10-CM | POA: Diagnosis not present

## 2019-11-02 DIAGNOSIS — F411 Generalized anxiety disorder: Secondary | ICD-10-CM

## 2019-11-02 DIAGNOSIS — F1010 Alcohol abuse, uncomplicated: Secondary | ICD-10-CM

## 2019-11-02 MED ORDER — BUPROPION HCL ER (XL) 300 MG PO TB24: ORAL_TABLET | ORAL | 3 refills | Status: DC

## 2019-11-02 MED ORDER — ALPRAZOLAM 0.5 MG PO TABS: 0.5000 mg | ORAL_TABLET | Freq: Three times a day (TID) | ORAL | 2 refills | Status: DC | PRN

## 2019-11-02 NOTE — Progress Notes (Signed)
Sandy [name] [id number] [birth year] 44 y.o.  Virtual Visit via Telephone Note  I connected with pt on 11/02/19 at 11:00 AM EDT by telephone and verified that I am speaking with the correct person using two identifiers.   I discussed the limitations, risks, security and privacy concerns of performing an evaluation and management service by telephone and the availability of in person appointments. I also discussed with the patient that there may be a patient responsible charge related to this service. The patient expressed understanding and agreed to proceed.   I discussed the assessment and treatment plan with the patient. The patient was provided an opportunity to ask questions and all were answered. The patient agreed with the plan and demonstrated an understanding of the instructions.   The patient was advised to call back or seek an in-person evaluation if the symptoms worsen or if the condition fails to improve as anticipated.  I provided 30 minutes of non-face-to-face time during this encounter.  The patient was located at home.  The provider was located at [place].   Aloha Gell, NP   Subjective:   Patient ID:  Sandy [name] is a 44 y.o. (DOB January 15, 1976) female.  Chief Complaint: No chief complaint on file.   HPI Sandy [name] presents for follow-up of anxiety and depression.  Describes mood today as "ok". Pleasant. Tearful at times. Mood symptoms - reports some depression, anxiety, and irritability. Stating "I'm having some down days". Getting the "birthday blues in April". Would like to increase Wellbutrin to help with mood symptoms. Breast reduction surgery on November 17, 2019. Mother has recovered from hospital stay - has returned to work part-time. Stable interest and motivation. Taking medications as prescribed and feels they are working well for her.  Energy levels stable. Active, does not have a regular exercise routine. Works full-time - 40  hours a week.  Enjoys some usual interests and activities. Single. Dating. No children. No pets. Mother in [place]. Extended family local.  Appetite adequate. Weight loss - working with [employer].  Sleeps well most nights. Averages 6 to 8 hours. Focus and concentration stable. Completing tasks. Managing aspects of household - "neat and clean". Working at home for [employer] for past 8 to 9 years.   Denies SI or HI. Denies AH or VH.  Previous medication trials: Lexapro  Review of Systems:  Review of Systems  Musculoskeletal: Negative for gait problem.  Neurological: Negative for tremors.  Psychiatric/Behavioral:       Please refer to HPI    Medications: I have reviewed the patient's current medications.  Current Outpatient Medications  Medication Sig Dispense Refill  . ALPRAZolam (XANAX) 0.5 MG tablet Take 1 tablet (0.5 mg total) by mouth 3 (three) times daily as needed. 90 tablet 2  . buPROPion (WELLBUTRIN XL) 150 MG 24 hr tablet Take 1 tablet (150 mg total) by mouth daily. 90 tablet 1  . Vitamin D, Ergocalciferol, (DRISDOL) 1.25 MG (50000 UNIT) CAPS capsule Take 1 capsule (50,000 Units total) by mouth every 7 (seven) days. 4 capsule 0   No current facility-administered medications for this visit.    Medication Side Effects: None  Allergies:  Allergies  Allergen Reactions  . Percocet [Oxycodone-Acetaminophen] Itching    Patient states that she still takes this medication even with itching.    Past Medical History:  Diagnosis Date  . Alcohol abuse   . Anxiety   . Depression   . Fracture of ankle    RT  . Numbness  OCASIONAL NUMBNESS RT LEG    Family History  Problem Relation Age of Onset  . Healthy Mother   . Healthy Father     Social History   Socioeconomic History  . Marital status: Single    Spouse name: Not on file  . Number of children: Not on file  . Years of education: Not on file  . Highest education level: Not on file  Occupational History   . Occupation: Conservation officer, nature  Tobacco Use  . Smoking status: Former Smoker    Packs/day: 0.50    Years: 12.00    Pack years: 6.00    Quit date: 07/30/2005    Years since quitting: 14.2  . Smokeless tobacco: Never Used  Substance and Sexual Activity  . Alcohol use: Yes    Comment: OCCASIONAL  . Drug use: No  . Sexual activity: Yes    Birth control/protection: None  Other Topics Concern  . Not on file  Social History Narrative  . Not on file   Social Determinants of Health   Financial Resource Strain:   . Difficulty of Paying Living Expenses:   Food Insecurity:   . Worried About Charity fundraiser in the Last Year:   . Arboriculturist in the Last Year:   Transportation Needs:   . Film/video editor (Medical):   Marland Kitchen Lack of Transportation (Non-Medical):   Physical Activity:   . Days of Exercise per Week:   . Minutes of Exercise per Session:   Stress:   . Feeling of Stress :   Social Connections:   . Frequency of Communication with Friends and Family:   . Frequency of Social Gatherings with Friends and Family:   . Attends Religious Services:   . Active Member of Clubs or Organizations:   . Attends Archivist Meetings:   Marland Kitchen Marital Status:   Intimate Partner Violence:   . Fear of Current or Ex-Partner:   . Emotionally Abused:   Marland Kitchen Physically Abused:   . Sexually Abused:     Past Medical History, Surgical history, Social history, and Family history were reviewed and updated as appropriate.   Please see review of systems for further details on the patient's review from today.   Objective:   Physical Exam:  There were no vitals taken for this visit.  Physical Exam Constitutional:      General: She is not in acute distress. Musculoskeletal:        General: No deformity.  Neurological:     Mental Status: She is alert and oriented to person, place, and time.     Coordination: Coordination normal.  Psychiatric:        Attention and Perception:  Attention and perception normal. She does not perceive auditory or visual hallucinations.        Mood and Affect: Mood is anxious and depressed. Affect is not labile, blunt, angry or inappropriate.        Speech: Speech normal.        Behavior: Behavior normal.        Thought Content: Thought content normal. Thought content is not paranoid or delusional. Thought content does not include homicidal or suicidal ideation. Thought content does not include homicidal or suicidal plan.        Cognition and Memory: Cognition and memory normal.        Judgment: Judgment normal.     Comments: Insight intact     Lab Review:     Component  Value Date/Time   NA 138 09/09/2019 0936   K 4.0 09/09/2019 0936   CL 100 09/09/2019 0936   CO2 21 09/09/2019 0936   GLUCOSE 87 09/09/2019 0936   GLUCOSE 95 02/04/2014 1306   BUN 6 09/09/2019 0936   CREATININE 0.72 09/09/2019 0936   CREATININE 0.65 02/04/2014 1306   CALCIUM 10.0 09/09/2019 0936   PROT 7.7 09/09/2019 0936   ALBUMIN 4.5 09/09/2019 0936   AST 31 09/09/2019 0936   ALT 32 09/09/2019 0936   ALKPHOS 60 09/09/2019 0936   BILITOT 0.4 09/09/2019 0936   GFRNONAA 104 09/09/2019 0936   GFRAA 120 09/09/2019 0936       Component Value Date/Time   WBC 7.7 09/09/2019 0936   WBC 6.2 03/03/2014 1315   RBC 5.30 (H) 09/09/2019 0936   RBC 5.41 (H) 03/03/2014 1315   HGB 12.7 09/09/2019 0936   HCT 40.3 09/09/2019 0936   PLT 350 09/09/2019 0936   MCV 76 (L) 09/09/2019 0936   MCH 24.0 (L) 09/09/2019 0936   MCH 23.5 (L) 03/03/2014 1315   MCHC 31.5 09/09/2019 0936   MCHC 32.0 03/03/2014 1315   RDW 14.8 09/09/2019 0936   LYMPHSABS 2.6 09/09/2019 0936   MONOABS 0.4 12/13/2013 0640   EOSABS 0.1 09/09/2019 0936   BASOSABS 0.0 09/09/2019 0936    No results found for: POCLITH, LITHIUM   No results found for: PHENYTOIN, PHENOBARB, VALPROATE, CBMZ   .res Assessment: Plan:    Plan:  PDMP reviewed  1. Increase Wellbutrin XL 150mg  to 300mg  daily -  denies seizure history. 2. Xanax 0.5mg  TID  RTC 4 weeks  Patient advised to contact office with any questions, adverse effects, or acute worsening in signs and symptoms.  Discussed potential benefits, risk, and side effects of benzodiazepines to include potential risk of tolerance and dependence, as well as possible drowsiness.  Advised patient not to drive if experiencing drowsiness and to take lowest possible effective dose to minimize risk of dependence and tolerance.   There are no diagnoses linked to this encounter.  Please see After Visit Summary for patient specific instructions.  Future Appointments  Date Time Provider [place]  11/09/2019  3:00 PM Abby Potash, PA-C [facility] None    No orders of the defined types were placed in this encounter.     -------------------------------

## 2019-11-09 ENCOUNTER — Other Ambulatory Visit: Payer: Self-pay

## 2019-11-09 ENCOUNTER — Ambulatory Visit (INDEPENDENT_AMBULATORY_CARE_PROVIDER_SITE_OTHER): Payer: 59 | Admitting: Physician Assistant

## 2019-11-09 ENCOUNTER — Encounter (INDEPENDENT_AMBULATORY_CARE_PROVIDER_SITE_OTHER): Payer: Self-pay | Admitting: Physician Assistant

## 2019-11-09 VITALS — BP 119/78 | HR 82 | Temp 98.1°F | Ht 62.0 in | Wt 221.0 lb

## 2019-11-09 DIAGNOSIS — E7849 Other hyperlipidemia: Secondary | ICD-10-CM

## 2019-11-09 DIAGNOSIS — E559 Vitamin D deficiency, unspecified: Secondary | ICD-10-CM

## 2019-11-09 DIAGNOSIS — Z9189 Other specified personal risk factors, not elsewhere classified: Secondary | ICD-10-CM

## 2019-11-09 DIAGNOSIS — Z6841 Body Mass Index (BMI) 40.0 and over, adult: Secondary | ICD-10-CM

## 2019-11-10 MED ORDER — VITAMIN D (ERGOCALCIFEROL) 1.25 MG (50000 UNIT) PO CAPS: 50000.0000 [IU] | ORAL_CAPSULE | ORAL | 0 refills | Status: DC

## 2019-11-10 NOTE — Progress Notes (Signed)
Chief Complaint:   Sandy [name] is here to discuss her progress with her obesity treatment plan along with follow-up of her obesity related diagnoses. Sandy [name] is keeping a food journal and adhering to recommended goals of 1500 calories and 85 grams of protein and states she is following her eating plan approximately 98% of the time. Sandy [name] states she is dancing 15 minutes 2 times per week.  Today's visit was #: 5 Starting weight: 240 lbs Starting date: 09/08/2019 Today's weight: 221 lbs Today's date: 11/09/2019 Total lbs lost to date: 19 Total lbs lost since last in-office visit: 7  Interim History: Sandy [name] states that she is doing a good job meeting her journaling goals. She is going to start exercising more and adding resistance training.  Subjective:   Vitamin D deficiency. Sandy [name] is on prescription Vitamin D. No nausea, vomiting, or muscle weakness. Last Vitamin D 10.6 on 09/09/2019.  Other hyperlipidemia. Sandy [name] is on no medications. No chest pain. She is exercising regularly.   Lab Results  Component Value Date   CHOL 212 (H) 09/09/2019   HDL 69 09/09/2019   LDLCALC 114 (H) 09/09/2019   TRIG 167 (H) 09/09/2019   CHOLHDL 3.6 02/04/2014   Lab Results  Component Value Date   ALT 32 09/09/2019   AST 31 09/09/2019   ALKPHOS 60 09/09/2019   BILITOT 0.4 09/09/2019   The 10-year ASCVD risk score Sandy Bussing DC Jr., et al., 2013) is: 0.3%   Values used to calculate the score:     Age: 44 years     Sex: Female     Is Non-Hispanic African American: Yes     Diabetic: No     Tobacco smoker: No     Systolic Blood Pressure: 123456 mmHg     Is BP treated: No     HDL Cholesterol: 69 mg/dL     Total Cholesterol: 212 mg/dL  At risk for heart disease. Sandy [name] is at a higher than average risk for cardiovascular disease due to obesity.   Assessment/Plan:   Vitamin D deficiency. Low Vitamin D level contributes to fatigue and are associated with obesity, breast, and  colon cancer. She was given a refill on her Vitamin D, Ergocalciferol, (DRISDOL) 1.25 MG (50000 UNIT) CAPS capsule every week #4 with 0 refills and will follow-up for routine testing of Vitamin D, at least 2-3 times per year to avoid over-replacement.     Other hyperlipidemia. Cardiovascular risk and specific lipid/LDL goals reviewed.  We discussed several lifestyle modifications today and Sandy [name] will continue to work on diet, exercise and weight loss efforts. Orders and follow up as documented in patient record.   Counseling Intensive lifestyle modifications are the first line treatment for this issue.  Dietary changes: Increase soluble fiber. Decrease simple carbohydrates.  Exercise changes: Moderate to vigorous-intensity aerobic activity 150 minutes per week if tolerated.  Lipid-lowering medications: see documented in medical record.  At risk for heart disease. Sandy [name] was given approximately 15 minutes of coronary artery disease prevention counseling today. She is 44 y.o. female and has risk factors for heart disease including obesity. We discussed intensive lifestyle modifications today with an emphasis on specific weight loss instructions and strategies.   Repetitive spaced learning was employed today to elicit superior memory formation and behavioral change.  Class 3 severe obesity with serious comorbidity and body mass index (BMI) of 40.0 to 44.9 in adult, unspecified obesity type ([place]).  Sandy [name] is currently in the action stage of change. As  such, her goal is to continue with weight loss efforts. She has agreed to keeping a food journal and adhering to recommended goals of 1500 calories and 85 grams of protein.   Exercise goals: For substantial health benefits, adults should do at least 150 minutes (2 hours and 30 minutes) a week of moderate-intensity, or 75 minutes (1 hour and 15 minutes) a week of vigorous-intensity aerobic physical activity, or an equivalent combination of moderate-  and vigorous-intensity aerobic activity. Aerobic activity should be performed in episodes of at least 10 minutes, and preferably, it should be spread throughout the week.  Behavioral modification strategies: meal planning and cooking strategies and keeping healthy foods in the home.  Sandy [name] has agreed to follow-up with our clinic in 2-3 weeks. She was informed of the importance of frequent follow-up visits to maximize her success with intensive lifestyle modifications for her multiple health conditions.   Objective:   Blood pressure 119/78, pulse 82, temperature 98.1 F (36.7 C), temperature source Oral, height 5\' 2"  (1.575 m), weight 221 lb (100.2 kg), SpO2 98 %. Body mass index is 40.42 kg/m.  General: Cooperative, alert, well developed, in no acute distress. HEENT: Conjunctivae and lids unremarkable. Cardiovascular: Regular rhythm.  Lungs: Normal work of breathing. Neurologic: No focal deficits.   Lab Results  Component Value Date   CREATININE 0.72 09/09/2019   BUN 6 09/09/2019   NA 138 09/09/2019   K 4.0 09/09/2019   CL 100 09/09/2019   CO2 21 09/09/2019   Lab Results  Component Value Date   ALT 32 09/09/2019   AST 31 09/09/2019   ALKPHOS 60 09/09/2019   BILITOT 0.4 09/09/2019   Lab Results  Component Value Date   HGBA1C 5.9 (H) 09/09/2019   HGBA1C 5.7 (H) 02/04/2014   Lab Results  Component Value Date   INSULIN 20.8 09/09/2019   Lab Results  Component Value Date   TSH 2.130 09/09/2019   Lab Results  Component Value Date   CHOL 212 (H) 09/09/2019   HDL 69 09/09/2019   LDLCALC 114 (H) 09/09/2019   TRIG 167 (H) 09/09/2019   CHOLHDL 3.6 02/04/2014   Lab Results  Component Value Date   WBC 7.7 09/09/2019   HGB 12.7 09/09/2019   HCT 40.3 09/09/2019   MCV 76 (L) 09/09/2019   PLT 350 09/09/2019   Lab Results  Component Value Date   IRON 86 02/04/2014   TIBC 418 02/04/2014   Attestation Statements:   Reviewed by clinician on day of visit: allergies,  medications, problem list, medical history, surgical history, family history, social history, and previous encounter notes.  IMichaelene Song, am acting as transcriptionist for Abby Potash, PA-C   I have reviewed the above documentation for accuracy and completeness, and I agree with the above. Abby Potash, PA-C

## 2019-11-17 ENCOUNTER — Telehealth: Payer: Self-pay | Admitting: Adult Health

## 2019-11-17 NOTE — Telephone Encounter (Signed)
Tried to reach patient but unable to leave message, voicemail not set up yet.

## 2019-11-17 NOTE — Telephone Encounter (Signed)
Pt called to report having side effects since increase in dose of Wellbutrin XL. Would like to talk about options. [phone].

## 2019-11-17 NOTE — Telephone Encounter (Signed)
Tried to reach patient but still unable to leave message

## 2019-11-18 NOTE — Telephone Encounter (Signed)
Typically Wellbutrin doesn't cause or can reverse SSE. Have her decrease dose and call with an update.

## 2019-11-18 NOTE — Telephone Encounter (Signed)
Pt stated med is effecting her sexually. Having a hard time reaching climax.

## 2019-11-18 NOTE — Telephone Encounter (Signed)
Ok - let's get a report from pt. OK to decrease if needed.

## 2019-11-19 ENCOUNTER — Ambulatory Visit: Payer: Self-pay | Admitting: Plastic Surgery

## 2019-11-24 ENCOUNTER — Other Ambulatory Visit: Payer: Self-pay

## 2019-11-24 ENCOUNTER — Encounter (HOSPITAL_BASED_OUTPATIENT_CLINIC_OR_DEPARTMENT_OTHER): Payer: Self-pay | Admitting: Plastic Surgery

## 2019-11-28 ENCOUNTER — Other Ambulatory Visit (HOSPITAL_COMMUNITY)
Admission: RE | Admit: 2019-11-28 | Discharge: 2019-11-28 | Disposition: A | Discharge status: Home / Self Care | Payer: 59 | Source: Ambulatory Visit | Attending: Plastic Surgery | Admitting: Plastic Surgery

## 2019-11-28 DIAGNOSIS — Z20822 Contact with and (suspected) exposure to covid-19: Secondary | ICD-10-CM | POA: Insufficient documentation

## 2019-11-28 DIAGNOSIS — Z01812 Encounter for preprocedural laboratory examination: Secondary | ICD-10-CM | POA: Insufficient documentation

## 2019-11-28 LAB — SARS CORONAVIRUS 2 (TAT 6-24 HRS): SARS Coronavirus 2: NEGATIVE

## 2019-11-30 ENCOUNTER — Other Ambulatory Visit: Payer: Self-pay

## 2019-11-30 ENCOUNTER — Ambulatory Visit (INDEPENDENT_AMBULATORY_CARE_PROVIDER_SITE_OTHER): Payer: 59 | Admitting: Physician Assistant

## 2019-11-30 VITALS — BP 126/79 | HR 75 | Temp 98.4°F | Ht 62.0 in | Wt 222.0 lb

## 2019-11-30 DIAGNOSIS — R7303 Prediabetes: Secondary | ICD-10-CM

## 2019-11-30 DIAGNOSIS — E559 Vitamin D deficiency, unspecified: Secondary | ICD-10-CM

## 2019-11-30 DIAGNOSIS — Z9189 Other specified personal risk factors, not elsewhere classified: Secondary | ICD-10-CM

## 2019-11-30 DIAGNOSIS — Z6841 Body Mass Index (BMI) 40.0 and over, adult: Secondary | ICD-10-CM

## 2019-11-30 MED ORDER — VITAMIN D (ERGOCALCIFEROL) 1.25 MG (50000 UNIT) PO CAPS: 50000.0000 [IU] | ORAL_CAPSULE | ORAL | 0 refills | Status: DC

## 2019-11-30 NOTE — Progress Notes (Signed)
Chief Complaint:   [place] is here to discuss her progress with her obesity treatment plan along with follow-up of her obesity related diagnoses. Arvilla is keeping a food journal and adhering to recommended goals of 1500 calories and 85 grams of protein and states she is following her eating plan approximately 50% of the time. Magaly states she is doing housework 30 minutes 2 times per week.  Today's visit was #: 6 Starting weight: 240 lbs Starting date: 09/08/2019 Today's weight: 222 lbs Today's date: 11/30/2019 Total lbs lost to date: 18 Total lbs lost since last in-office visit: 0  Interim History: Thyme states that she was in [place] for her birthday and did not eat on plan. She is having breast reduction surgery in 2 days.  Subjective:   Vitamin D deficiency. Tequia is on prescription Vitamin D. No nausea, vomiting, or muscle weakness. Last Vitamin D 10.6 on 09/09/2019.  Prediabetes. Zayiah has a diagnosis of prediabetes based on her elevated HgA1c and was informed this puts her at greater risk of developing diabetes. She continues to work on diet and exercise to decrease her risk of diabetes. She denies nausea or hypoglycemia. Soila is on no medication. No polyphagia.  Lab Results  Component Value Date   HGBA1C 5.9 (H) 09/09/2019   Lab Results  Component Value Date   INSULIN 20.8 09/09/2019   At risk for osteoporosis. Cherena is at higher risk of osteopenia and osteoporosis due to Vitamin D deficiency.   Assessment/Plan:   Vitamin D deficiency. Low Vitamin D level contributes to fatigue and are associated with obesity, breast, and colon cancer. She was given a refill on her  Vitamin D, Ergocalciferol, (DRISDOL) 1.25 MG (50000 UNIT) CAPS capsule every week #4 with 0 refills and will follow-up for routine testing of Vitamin D, at least 2-3 times per year to avoid over-replacement.   Prediabetes. Kaydynce will continue to work on weight loss,  exercise, and decreasing simple carbohydrates to help decrease the risk of diabetes.   At risk for osteoporosis. Zaineb was given approximately 15 minutes of osteoporosis prevention counseling today. Lovelee is at risk for osteopenia and osteoporosis due to her Vitamin D deficiency. She was encouraged to take her Vitamin D and follow her higher calcium diet and increase strengthening exercise to help strengthen her bones and decrease her risk of osteopenia and osteoporosis.  Repetitive spaced learning was employed today to elicit superior memory formation and behavioral change.  Class 3 severe obesity with serious comorbidity and body mass index (BMI) of 40.0 to 44.9 in adult, unspecified obesity type ([place]).  Rhondalyn is currently in the action stage of change. As such, her goal is to continue with weight loss efforts. She has agreed to keeping a food journal and adhering to recommended goals of 1500 calories and 95 grams of protein daily.   Exercise goals: For substantial health benefits, adults should do at least 150 minutes (2 hours and 30 minutes) a week of moderate-intensity, or 75 minutes (1 hour and 15 minutes) a week of vigorous-intensity aerobic physical activity, or an equivalent combination of moderate- and vigorous-intensity aerobic activity. Aerobic activity should be performed in episodes of at least 10 minutes, and preferably, it should be spread throughout the week.  Behavioral modification strategies: meal planning and cooking strategies and keeping healthy foods in the home.  Nikesha has agreed to follow-up with our clinic in 3-4 weeks. She was informed of the importance of frequent follow-up visits to maximize  her success with intensive lifestyle modifications for her multiple health conditions.   Objective:   Blood pressure 126/79, pulse 75, temperature 98.4 F (36.9 C), height 5\' 2"  (1.575 m), weight 222 lb (100.7 kg), SpO2 100 %. Body mass index is 40.6 kg/m.  General:  Cooperative, alert, well developed, in no acute distress. HEENT: Conjunctivae and lids unremarkable. Cardiovascular: Regular rhythm.  Lungs: Normal work of breathing. Neurologic: No focal deficits.   Lab Results  Component Value Date   CREATININE 0.72 09/09/2019   BUN 6 09/09/2019   NA 138 09/09/2019   K 4.0 09/09/2019   CL 100 09/09/2019   CO2 21 09/09/2019   Lab Results  Component Value Date   ALT 32 09/09/2019   AST 31 09/09/2019   ALKPHOS 60 09/09/2019   BILITOT 0.4 09/09/2019   Lab Results  Component Value Date   HGBA1C 5.9 (H) 09/09/2019   HGBA1C 5.7 (H) 02/04/2014   Lab Results  Component Value Date   INSULIN 20.8 09/09/2019   Lab Results  Component Value Date   TSH 2.130 09/09/2019   Lab Results  Component Value Date   CHOL 212 (H) 09/09/2019   HDL 69 09/09/2019   LDLCALC 114 (H) 09/09/2019   TRIG 167 (H) 09/09/2019   CHOLHDL 3.6 02/04/2014   Lab Results  Component Value Date   WBC 7.7 09/09/2019   HGB 12.7 09/09/2019   HCT 40.3 09/09/2019   MCV 76 (L) 09/09/2019   PLT 350 09/09/2019   Lab Results  Component Value Date   IRON 86 02/04/2014   TIBC 418 02/04/2014   Attestation Statements:   Reviewed by clinician on day of visit: allergies, medications, problem list, medical history, surgical history, family history, social history, and previous encounter notes.  IMichaelene Song, am acting as transcriptionist for Abby Potash, PA-C   I have reviewed the above documentation for accuracy and completeness, and I agree with the above. Abby Potash, PA-C

## 2019-11-30 NOTE — Progress Notes (Signed)
Surgical soap given with instructions, pt verbalized understanding.  

## 2019-12-01 ENCOUNTER — Ambulatory Visit: Payer: Self-pay | Admitting: Plastic Surgery

## 2019-12-01 NOTE — Anesthesia Preprocedure Evaluation (Addendum)
Anesthesia Evaluation  Patient identified by MRN, date of birth, ID band Patient awake    Reviewed: Allergy & Precautions, NPO status , Patient's Chart, lab work & pertinent test results  History of Anesthesia Complications Negative for: history of anesthetic complications  Airway Mallampati: III  TM Distance: >3 FB Neck ROM: Full    Dental no notable dental hx. (+) Dental Advisory Given   Pulmonary former smoker,    Pulmonary exam normal        Cardiovascular negative cardio ROS Normal cardiovascular exam     Neuro/Psych PSYCHIATRIC DISORDERS Anxiety Depression negative neurological ROS     GI/Hepatic negative GI ROS, Neg liver ROS,   Endo/Other  negative endocrine ROS  Renal/GU negative Renal ROS     Musculoskeletal negative musculoskeletal ROS (+)   Abdominal   Peds  Hematology negative hematology ROS (+)   Anesthesia Other Findings Day of surgery medications reviewed with the patient.  Reproductive/Obstetrics                            Anesthesia Physical Anesthesia Plan  ASA: III  Anesthesia Plan: General   Post-op Pain Management:    Induction: Intravenous  PONV Risk Score and Plan: 4 or greater and Ondansetron, Dexamethasone, Midazolam and Scopolamine patch - Pre-op  Airway Management Planned: Oral ETT  Additional Equipment:   Intra-op Plan:   Post-operative Plan: Extubation in OR  Informed Consent: I have reviewed the patients History and Physical, chart, labs and discussed the procedure including the risks, benefits and alternatives for the proposed anesthesia with the patient or authorized representative who has indicated his/her understanding and acceptance.     Dental advisory given  Plan Discussed with: Anesthesiologist  Anesthesia Plan Comments:        Anesthesia Quick Evaluation

## 2019-12-02 ENCOUNTER — Encounter (HOSPITAL_BASED_OUTPATIENT_CLINIC_OR_DEPARTMENT_OTHER): Payer: Self-pay | Admitting: Plastic Surgery

## 2019-12-02 ENCOUNTER — Ambulatory Visit (HOSPITAL_BASED_OUTPATIENT_CLINIC_OR_DEPARTMENT_OTHER): Payer: No Typology Code available for payment source | Admitting: Certified Registered"

## 2019-12-02 ENCOUNTER — Encounter (HOSPITAL_BASED_OUTPATIENT_CLINIC_OR_DEPARTMENT_OTHER)
Admission: RE | Disposition: A | Discharge status: Home / Self Care | Payer: Self-pay | Source: Home / Self Care | Attending: Plastic Surgery

## 2019-12-02 ENCOUNTER — Other Ambulatory Visit: Payer: Self-pay

## 2019-12-02 ENCOUNTER — Ambulatory Visit (HOSPITAL_BASED_OUTPATIENT_CLINIC_OR_DEPARTMENT_OTHER)
Admission: RE | Admit: 2019-12-02 | Discharge: 2019-12-02 | Disposition: A | Discharge status: Home / Self Care | Payer: No Typology Code available for payment source | Attending: Plastic Surgery | Admitting: Plastic Surgery

## 2019-12-02 DIAGNOSIS — N62 Hypertrophy of breast: Secondary | ICD-10-CM | POA: Insufficient documentation

## 2019-12-02 DIAGNOSIS — Z87891 Personal history of nicotine dependence: Secondary | ICD-10-CM | POA: Diagnosis not present

## 2019-12-02 DIAGNOSIS — N6489 Other specified disorders of breast: Secondary | ICD-10-CM | POA: Diagnosis not present

## 2019-12-02 HISTORY — PX: BREAST REDUCTION SURGERY: SHX8

## 2019-12-02 LAB — POCT PREGNANCY, URINE: Preg Test, Ur: NEGATIVE

## 2019-12-02 SURGERY — MAMMOPLASTY, REDUCTION
Anesthesia: General | Site: Breast | Laterality: Bilateral

## 2019-12-02 MED ORDER — PROPOFOL 10 MG/ML IV BOLUS
INTRAVENOUS | Status: DC | PRN
  Administered 2019-12-02: 200 mg via INTRAVENOUS

## 2019-12-02 MED ORDER — BUPIVACAINE LIPOSOME 1.3 % IJ SUSP
INTRAMUSCULAR | Status: AC
  Filled 2019-12-02: qty 20

## 2019-12-02 MED ORDER — FENTANYL CITRATE (PF) 100 MCG/2ML IJ SOLN: 50.0000 ug | INTRAMUSCULAR | Status: DC | PRN

## 2019-12-02 MED ORDER — PHENYLEPHRINE 40 MCG/ML (10ML) SYRINGE FOR IV PUSH (FOR BLOOD PRESSURE SUPPORT)
PREFILLED_SYRINGE | INTRAVENOUS | Status: AC
  Filled 2019-12-02: qty 10

## 2019-12-02 MED ORDER — MIDAZOLAM HCL 2 MG/2ML IJ SOLN: 1.0000 mg | INTRAMUSCULAR | Status: DC | PRN

## 2019-12-02 MED ORDER — FENTANYL CITRATE (PF) 100 MCG/2ML IJ SOLN
INTRAMUSCULAR | Status: DC | PRN
  Administered 2019-12-02 (×2): 50 ug via INTRAVENOUS
  Administered 2019-12-02: 100 ug via INTRAVENOUS
  Administered 2019-12-02 (×3): 50 ug via INTRAVENOUS

## 2019-12-02 MED ORDER — BUPIVACAINE HCL (PF) 0.5 % IJ SOLN
INTRAMUSCULAR | Status: AC
  Filled 2019-12-02: qty 30

## 2019-12-02 MED ORDER — CEFAZOLIN SODIUM-DEXTROSE 2-4 GM/100ML-% IV SOLN
INTRAVENOUS | Status: AC
  Filled 2019-12-02: qty 100

## 2019-12-02 MED ORDER — BUPIVACAINE HCL (PF) 0.5 % IJ SOLN
INTRAMUSCULAR | Status: DC | PRN
  Administered 2019-12-02: 30 mL

## 2019-12-02 MED ORDER — FENTANYL CITRATE (PF) 100 MCG/2ML IJ SOLN
INTRAMUSCULAR | Status: AC
  Filled 2019-12-02: qty 2

## 2019-12-02 MED ORDER — BUPIVACAINE LIPOSOME 1.3 % IJ SUSP
INTRAMUSCULAR | Status: DC | PRN
  Administered 2019-12-02: 20 mL

## 2019-12-02 MED ORDER — CEFAZOLIN SODIUM-DEXTROSE 2-4 GM/100ML-% IV SOLN
2.0000 g | INTRAVENOUS | Status: AC
  Administered 2019-12-02: 2 g via INTRAVENOUS

## 2019-12-02 MED ORDER — SODIUM CHLORIDE (PF) 0.9 % IJ SOLN
INTRAMUSCULAR | Status: AC
  Filled 2019-12-02: qty 20

## 2019-12-02 MED ORDER — PROMETHAZINE HCL 25 MG/ML IJ SOLN: 6.2500 mg | INTRAMUSCULAR | Status: DC | PRN

## 2019-12-02 MED ORDER — ALBUMIN HUMAN 5 % IV SOLN
INTRAVENOUS | Status: DC | PRN
Start: 2019-12-02 — End: 2019-12-02

## 2019-12-02 MED ORDER — LIDOCAINE-EPINEPHRINE 1 %-1:100000 IJ SOLN
INTRAMUSCULAR | Status: DC | PRN
  Administered 2019-12-02: 20 mL

## 2019-12-02 MED ORDER — HYDROMORPHONE HCL 1 MG/ML IJ SOLN
INTRAMUSCULAR | Status: AC
  Filled 2019-12-02: qty 0.5

## 2019-12-02 MED ORDER — LIDOCAINE-EPINEPHRINE 1 %-1:100000 IJ SOLN
INTRAMUSCULAR | Status: AC
  Filled 2019-12-02: qty 1

## 2019-12-02 MED ORDER — SUGAMMADEX SODIUM 200 MG/2ML IV SOLN
INTRAVENOUS | Status: DC | PRN
  Administered 2019-12-02: 200 mg via INTRAVENOUS

## 2019-12-02 MED ORDER — PROPOFOL 500 MG/50ML IV EMUL
INTRAVENOUS | Status: DC | PRN
Start: 2019-12-02 — End: 2019-12-02
  Administered 2019-12-02: 25 ug/kg/min via INTRAVENOUS

## 2019-12-02 MED ORDER — MIDAZOLAM HCL 2 MG/2ML IJ SOLN
INTRAMUSCULAR | Status: AC
  Filled 2019-12-02: qty 2

## 2019-12-02 MED ORDER — SUGAMMADEX SODIUM 500 MG/5ML IV SOLN
INTRAVENOUS | Status: AC
  Filled 2019-12-02: qty 5

## 2019-12-02 MED ORDER — CHLORHEXIDINE GLUCONATE CLOTH 2 % EX PADS: 6.0000 | MEDICATED_PAD | Freq: Once | CUTANEOUS | Status: DC

## 2019-12-02 MED ORDER — BUPIVACAINE HCL (PF) 0.25 % IJ SOLN
INTRAMUSCULAR | Status: DC | PRN
  Administered 2019-12-02: 20 mL

## 2019-12-02 MED ORDER — HYDROMORPHONE HCL 1 MG/ML IJ SOLN
0.2500 mg | INTRAMUSCULAR | Status: DC | PRN
  Administered 2019-12-02: 0.25 mg via INTRAVENOUS

## 2019-12-02 MED ORDER — BUPIVACAINE HCL (PF) 0.25 % IJ SOLN
INTRAMUSCULAR | Status: AC
  Filled 2019-12-02: qty 30

## 2019-12-02 MED ORDER — LACTATED RINGERS IV SOLN: INTRAVENOUS | Status: DC

## 2019-12-02 MED ORDER — ONDANSETRON HCL 4 MG/2ML IJ SOLN
INTRAMUSCULAR | Status: DC | PRN
  Administered 2019-12-02: 4 mg via INTRAVENOUS

## 2019-12-02 MED ORDER — MIDAZOLAM HCL 5 MG/5ML IJ SOLN
INTRAMUSCULAR | Status: DC | PRN
  Administered 2019-12-02: 2 mg via INTRAVENOUS

## 2019-12-02 MED ORDER — DEXAMETHASONE SODIUM PHOSPHATE 4 MG/ML IJ SOLN
INTRAMUSCULAR | Status: DC | PRN
  Administered 2019-12-02: 5 mg via INTRAVENOUS

## 2019-12-02 MED ORDER — LIDOCAINE HCL (CARDIAC) PF 100 MG/5ML IV SOSY
PREFILLED_SYRINGE | INTRAVENOUS | Status: DC | PRN
  Administered 2019-12-02: 60 mg via INTRAVENOUS

## 2019-12-02 MED ORDER — ROCURONIUM BROMIDE 10 MG/ML (PF) SYRINGE
PREFILLED_SYRINGE | INTRAVENOUS | Status: AC
  Filled 2019-12-02: qty 20

## 2019-12-02 MED ORDER — BACITRACIN ZINC 500 UNIT/GM EX OINT
TOPICAL_OINTMENT | CUTANEOUS | Status: AC
  Filled 2019-12-02: qty 28.35

## 2019-12-02 MED ORDER — ROCURONIUM BROMIDE 100 MG/10ML IV SOLN
INTRAVENOUS | Status: DC | PRN
  Administered 2019-12-02: 80 mg via INTRAVENOUS
  Administered 2019-12-02: 20 mg via INTRAVENOUS

## 2019-12-02 MED ORDER — SODIUM CHLORIDE (PF) 0.9 % IJ SOLN
INTRAMUSCULAR | Status: DC | PRN
  Administered 2019-12-02: 90 mL
  Administered 2019-12-02: 20 mL via INTRAVENOUS

## 2019-12-02 SURGICAL SUPPLY — 59 items
BAG DECANTER FOR FLEXI CONT (MISCELLANEOUS) ×3 IMPLANT
BLADE KNIFE PERSONA 10 (BLADE) ×18 IMPLANT
BLADE KNIFE PERSONA 15 (BLADE) ×7 IMPLANT
BNDG GAUZE ELAST 4 BULKY (GAUZE/BANDAGES/DRESSINGS) ×6 IMPLANT
CANISTER SUCT 1200ML W/VALVE (MISCELLANEOUS) ×3 IMPLANT
CAP BOUFFANT 24 BLUE NURSES (PROTECTIVE WEAR) ×3 IMPLANT
CLOSURE WOUND 1/2 X4 (GAUZE/BANDAGES/DRESSINGS) ×4
COVER BACK TABLE 60X90IN (DRAPES) ×3 IMPLANT
COVER MAYO STAND STRL (DRAPES) ×3 IMPLANT
DECANTER SPIKE VIAL GLASS SM (MISCELLANEOUS) ×6 IMPLANT
DRAIN CHANNEL 10F 3/8 F FF (DRAIN) ×6 IMPLANT
DRAPE U-SHAPE 76X120 STRL (DRAPES) ×4 IMPLANT
DRSG EMULSION OIL 3X3 NADH (GAUZE/BANDAGES/DRESSINGS) ×6 IMPLANT
DRSG PAD ABDOMINAL 8X10 ST (GAUZE/BANDAGES/DRESSINGS) ×6 IMPLANT
ELECT BLADE 4.0 EZ CLEAN MEGAD (MISCELLANEOUS) ×6
ELECT REM PT RETURN 9FT ADLT (ELECTROSURGICAL) ×3
ELECTRODE BLDE 4.0 EZ CLN MEGD (MISCELLANEOUS) IMPLANT
ELECTRODE REM PT RTRN 9FT ADLT (ELECTROSURGICAL) ×1 IMPLANT
EVACUATOR SILICONE 100CC (DRAIN) ×6 IMPLANT
GAUZE SPONGE 4X4 12PLY STRL (GAUZE/BANDAGES/DRESSINGS) ×6 IMPLANT
GLOVE BIO SURGEON STRL SZ 6.5 (GLOVE) ×5 IMPLANT
GLOVE BIO SURGEON STRL SZ7 (GLOVE) ×5 IMPLANT
GLOVE BIO SURGEON STRL SZ7.5 (GLOVE) ×2 IMPLANT
GLOVE BIO SURGEONS STRL SZ 6.5 (GLOVE) ×5
GLOVE BIOGEL PI IND STRL 6.5 (GLOVE) IMPLANT
GLOVE BIOGEL PI INDICATOR 6.5 (GLOVE) ×4
GOWN STRL REUS W/ TWL LRG LVL3 (GOWN DISPOSABLE) ×2 IMPLANT
GOWN STRL REUS W/TWL LRG LVL3 (GOWN DISPOSABLE) ×15
NDL HYPO 25X1 1.5 SAFETY (NEEDLE) ×3 IMPLANT
NDL SPNL 18GX3.5 QUINCKE PK (NEEDLE) ×1 IMPLANT
NEEDLE HYPO 25X1 1.5 SAFETY (NEEDLE) ×6 IMPLANT
NEEDLE SPNL 18GX3.5 QUINCKE PK (NEEDLE) ×3 IMPLANT
NS IRRIG 1000ML POUR BTL (IV SOLUTION) ×6 IMPLANT
PENCIL SMOKE EVACUATOR (MISCELLANEOUS) ×5 IMPLANT
PIN SAFETY STERILE (MISCELLANEOUS) ×3 IMPLANT
SCRUB TECHNI CARE 4 OZ NO DYE (MISCELLANEOUS) ×3 IMPLANT
SET BASIN DAY SURGERY F.S. (CUSTOM PROCEDURE TRAY) ×3 IMPLANT
SLEEVE SCD COMPRESS KNEE MED (MISCELLANEOUS) ×3 IMPLANT
SPONGE LAP 18X18 RF (DISPOSABLE) ×11 IMPLANT
STAPLER VISISTAT 35W (STAPLE) ×3 IMPLANT
STRIP CLOSURE SKIN 1/2X4 (GAUZE/BANDAGES/DRESSINGS) ×8 IMPLANT
SUT ETHILON 3 0 PS 1 (SUTURE) ×3 IMPLANT
SUT MNCRL AB 3-0 PS2 18 (SUTURE) ×14 IMPLANT
SUT MNCRL AB 4-0 PS2 18 (SUTURE) ×6 IMPLANT
SUT MON AB 5-0 PS2 18 (SUTURE) ×6 IMPLANT
SUT PROLENE 2 0 CT2 30 (SUTURE) ×3 IMPLANT
SUT PROLENE 3 0 PS 1 (SUTURE) ×6 IMPLANT
SUT VLOC 90 P-14 23 (SUTURE) ×6 IMPLANT
SYR 20ML LL LF (SYRINGE) ×3 IMPLANT
SYR BULB IRRIG 60ML STRL (SYRINGE) ×6 IMPLANT
SYR CONTROL 10ML LL (SYRINGE) ×6 IMPLANT
TAPE MEASURE VINYL STERILE (MISCELLANEOUS) ×3 IMPLANT
TOWEL GREEN STERILE FF (TOWEL DISPOSABLE) ×9 IMPLANT
TRAY DSU PREP LF (CUSTOM PROCEDURE TRAY) ×3 IMPLANT
TRAY FOLEY W/BAG SLVR 14FR LF (SET/KITS/TRAYS/PACK) ×2 IMPLANT
TUBE CONNECTING 20'X1/4 (TUBING) ×1
TUBE CONNECTING 20X1/4 (TUBING) ×2 IMPLANT
UNDERPAD 30X36 HEAVY ABSORB (UNDERPADS AND DIAPERS) ×6 IMPLANT
YANKAUER SUCT BULB TIP NO VENT (SUCTIONS) ×3 IMPLANT

## 2019-12-02 NOTE — Discharge Instructions (Signed)
1. No lifting greater than 5 lbs with arms for 4 weeks. 2. Empty, strip, record and reactivate JP drains 3 times a day. 3. Percocet 5/325 mg tabs 1-2 tabs po q 4-6 hours prn pain- prescription given in office. 4. Duricef 1 tab po bid- prescription given in office. 5. Sterapred dose pack as directed- prescription given in office. 6. Follow-up appointment Friday in office.    Post Anesthesia Home Care Instructions  Activity: Get plenty of rest for the remainder of the day. A responsible individual must stay with you for 24 hours following the procedure.  For the next 24 hours, DO NOT: -Drive a car -Paediatric nurse -Drink alcoholic beverages -Take any medication unless instructed by your physician -Make any legal decisions or sign important papers.  Meals: Start with liquid foods such as gelatin or soup. Progress to regular foods as tolerated. Avoid greasy, spicy, heavy foods. If nausea and/or vomiting occur, drink only clear liquids until the nausea and/or vomiting subsides. Call your physician if vomiting continues.  Special Instructions/Symptoms: Your throat may feel dry or sore from the anesthesia or the breathing tube placed in your throat during surgery. If this causes discomfort, gargle with warm salt water. The discomfort should disappear within 24 hours.  If you had a scopolamine patch placed behind your ear for the management of post- operative nausea and/or vomiting:  1. The medication in the patch is effective for 72 hours, after which it should be removed.  Wrap patch in a tissue and discard in the trash. Wash hands thoroughly with soap and water. 2. You may remove the patch earlier than 72 hours if you experience unpleasant side effects which may include dry mouth, dizziness or visual disturbances. 3. Avoid touching the patch. Wash your hands with soap and water after contact with the patch.    Information for Discharge Teaching: EXPAREL (bupivacaine liposome injectable  suspension)   Your surgeon or anesthesiologist gave you EXPAREL(bupivacaine) to help control your pain after surgery.   EXPAREL is a local anesthetic that provides pain relief by numbing the tissue around the surgical site.  EXPAREL is designed to release pain medication over time and can control pain for up to 72 hours.  Depending on how you respond to EXPAREL, you may require less pain medication during your recovery.  Possible side effects:  Temporary loss of sensation or ability to move in the area where bupivacaine was injected.  Nausea, vomiting, constipation  Rarely, numbness and tingling in your mouth or lips, lightheadedness, or anxiety may occur.  Call your doctor right away if you think you may be experiencing any of these sensations, or if you have other questions regarding possible side effects.  Follow all other discharge instructions given to you by your surgeon or nurse. Eat a healthy diet and drink plenty of water or other fluids.  If you return to the hospital for any reason within 96 hours following the administration of EXPAREL, it is important for health care providers to know that you have received this anesthetic. A teal colored band has been placed on your arm with the date, time and amount of EXPAREL you have received in order to alert and inform your health care providers. Please leave this armband in place for the full 96 hours following administration, and then you may remove the band.    JP Drain [employer] this sheet to all of your post-operative appointments while you have your drains.  Please measure your drains by CC's  or ML's.  Make sure you drain and measure your JP Drains 2 or 3 times per day.  At the end of each day, add up totals for the left side and add up totals for the right side.    ( 9 am )     ( 3 pm )        ( 9 pm )                Date L  R  L  R  L  R  Total L/R

## 2019-12-02 NOTE — Op Note (Signed)
OPERATIVE REPORT  12/02/2019  Sandy [name]  PREOPERATIVE DIAGNOSIS:  Bilateral macromastia.  POSTOPERATIVE DIAGNOSIS:  Bilateral macromastia.  PROCEDURE:  Bilateral reduction mammoplasties.  ATTENDING SURGEON:  Youlanda Roys, MD  ANESTHESIA:  General.  ANESTHESIOLOGISTWeldon Inches, MD  COMPLICATIONS:  None.  INDICATIONS FOR THE PROCEDURE:  The patient is a 44 y.o. female who has bilateral macromastia that is clinically symptomatic.  She presents to undergo bilateral reduction mammoplasties.  DESCRIPTION OF PROCEDURE:  The patient was marked in preop holding area in a pattern of Wise for the future bilateral reduction mammoplasties. She was then taken back to the OR, placed on the table in supine position.  After adequate general anesthesia was obtained, the patient's chest was prepped with Techni-Care and draped in sterile fashion.  The bases of the breasts have been infiltrated with 1% lidocaine with epinephrine.  After adequate hemostasis and anesthesia taken effect, the procedure was begun.  Both of the breast reductions were performed in the following similar manner.  The nipple-areolar complex was marked with a 45-mm nipple marker.  The skin was then incised and deepithelialized around the nipple-areolar complex down to the inframammary crease in the inferior pedicle pattern.  Next, the medial, superior, and lateral skin flaps were elevated down to the chest wall.  Excess fat and glandular tissue removed from the inferior pedicle.  The nipple-areolar complex was examined and found to be pink and viable.  The wound was irrigated with saline irrigation.  Meticulous hemostasis was obtained with the Bovie electrocautery.  Inferior pedicle was centralized using 3-0 Prolene suture.  A #10 JP flat fully fluted drain was placed into the wound. The skin flaps were brought together at the inverted T junction with a 2- 0 Prolene suture.  The incisions were stapled  for temporary closure. The breasts compared and found to have good shape and symmetry.  The incisions were then closed from the medial aspect of the JP drain to the medial aspect of the [facility] incision by first placing a few 3-0 Monocryl sutures to tack together the dermal layer, and then both the dermal and cuticular layer were closed in a single layer using a 3-0 Quill V-lock barbed suture.  Lateral to the JP drain incision was closed using 3-0 Monocryl in the dermal layer, followed by 3-0 Monocryl running intracuticular stitch on the skin.  The vertical limb of the Wise pattern was closed in the dermal layer using 3-0 Monocryl suture.  The patient was placed in the upright position.  The future location of the nipple-areolar complexes was marked on both breast mounds using the 45-mm nipple marker.  She was then placed back in the recumbent position.  Both of the nipple areolar complexes were brought out onto the breast mounds in the following similar manner.  The skin was incised as marked and removed in full thickness into the subcutaneous tissues.  The nipple- areolar complex was examined, found to be pink and viable, then brought out through this aperture and sewn in place using 4-0 Monocryl in the dermal layer, followed by 5-0 Monocryl running intracuticular stitch on the skin.  This 5-0 Monocryl suture was then brought down to close the cuticular layer of the vertical limb as well.  The JP drain was sewn in place using 3-0 nylon suture.  The pectoralis major muscle and fascia along with the breast and chest soft tissues were then infiltrated with 1% Exparel (total 266 mg).  Now the [facility] incision was also infiltrated with  the Exparel in order to give the patient postoperative pain control.  The incisions were dressed with benzoin, Steri-Strips, and the nipples dressed with bacitracin ointment and Adaptic.  4x4s were placed over the incisions and ABD pads in the axillary areas.  The  patient was placed into a light postoperative support bra.  There were no complications. The patient tolerated the procedure well.  The final needle, sponge counts were reported to be correct at the end of the case.  The patient was then recovered without complications.  Both the patient and her family were given proper postoperative wound care instructions. She was then discharged home in the care of her family in stable condition.  Follow up will be with me in a few days in the office.         Youlanda Roys, M.D.  12/02/2019 1:26 PM

## 2019-12-02 NOTE — Anesthesia Postprocedure Evaluation (Signed)
Anesthesia Post Note  Patient: Sandy [name]  Procedure(s) Performed: BILATERAL MAMMARY REDUCTION  (BREAST) (Bilateral Breast)     Patient location during evaluation: PACU Anesthesia Type: General Level of consciousness: sedated Pain management: pain level controlled Vital Signs Assessment: post-procedure vital signs reviewed and stable Respiratory status: spontaneous breathing and respiratory function stable Cardiovascular status: stable Postop Assessment: no apparent nausea or vomiting Anesthetic complications: no    Last Vitals:  Vitals:   12/02/19 1500 12/02/19 1515  BP: 121/79 125/78  Pulse: 94 94  Resp: 18 19  Temp:    SpO2: 97% 98%    Last Pain:  Vitals:   12/02/19 1440  TempSrc:   PainSc: 3                  Phebe Dettmer DANIEL

## 2019-12-02 NOTE — Brief Op Note (Signed)
12/02/2019  1:23 PM  PATIENT:  Sandy [name]  44 y.o. female  PRE-OPERATIVE DIAGNOSIS:  BILATERAL MACROMASTIA  POST-OPERATIVE DIAGNOSIS:  BILATERAL MACROMASTIA  PROCEDURE:  Procedure(s): BILATERAL MAMMARY REDUCTION  (BREAST) (Bilateral)  SURGEON:  Surgeon(s) and Role:    * Contogiannis, Audrea Muscat, MD - Primary  ASSISTANTS: Marge Abanto-Walston, RNFA  ANESTHESIA:   general  EBL:  100 mL   BLOOD ADMINISTERED:none  DRAINS: (42F) Jackson-Pratt drain(s) with closed bulb suction in the Bilateral Breasts   LOCAL MEDICATIONS USED:  1.3% Exparel (total 266 mgs.)  SPECIMEN:  Source of Specimen:  Bilateral Breasts  DISPOSITION OF SPECIMEN:  PATHOLOGY  COUNTS:  YES  DICTATION: .Note written in EPIC  PLAN OF CARE: Discharge to home after PACU  PATIENT DISPOSITION:  PACU - hemodynamically stable.   Delay start of Pharmacological VTE agent (>24hrs) due to surgical blood loss or risk of bleeding: not applicable

## 2019-12-02 NOTE — Transfer of Care (Signed)
Immediate Anesthesia Transfer of Care Note  Patient: Sandy [name]  Procedure(s) Performed: BILATERAL MAMMARY REDUCTION  (BREAST) (Bilateral Breast)  Patient Location: PACU  Anesthesia Type:General  Level of Consciousness: drowsy and patient cooperative  Airway & Oxygen Therapy: Patient Spontanous Breathing and Patient connected to face mask oxygen  Post-op Assessment: Report given to RN and Post -op Vital signs reviewed and stable  Post vital signs: Reviewed and stable  Last Vitals:  Vitals Value Taken Time  BP    Temp    Pulse 99 12/02/19 1337  Resp 32 12/02/19 1337  SpO2 97 % 12/02/19 1337  Vitals shown include unvalidated device data.  Last Pain:  Vitals:   12/02/19 0800  TempSrc: Oral  PainSc: 0-No pain      Patients Stated Pain Goal: 3 ([id number])  Complications: No apparent anesthesia complications

## 2019-12-02 NOTE — H&P (Signed)
  H&P faxed to surgical center.  -History and Physical Reviewed  -Patient has been re-examined  -No change in the plan of care  Sandy [name] A    

## 2019-12-02 NOTE — Anesthesia Procedure Notes (Signed)
Procedure Name: Intubation Date/Time: 12/02/2019 8:57 AM Performed by: Signe Colt, CRNA Pre-anesthesia Checklist: Patient identified, Emergency Drugs available, Suction available and Patient being monitored Patient Re-evaluated:Patient Re-evaluated prior to induction Oxygen Delivery Method: Circle system utilized Preoxygenation: Pre-oxygenation with 100% oxygen Induction Type: IV induction Ventilation: Mask ventilation without difficulty Laryngoscope Size: Miller and 2 Grade View: Grade II Tube type: Oral Tube size: 7.0 mm Number of attempts: 2 Airway Equipment and Method: Stylet and Oral airway Placement Confirmation: ETT inserted through vocal cords under direct vision,  positive ETCO2 and breath sounds checked- equal and bilateral Secured at: 21 cm Tube secured with: Tape Dental Injury: Teeth and Oropharynx as per pre-operative assessment

## 2019-12-04 ENCOUNTER — Encounter: Payer: Self-pay | Admitting: *Deleted

## 2019-12-04 LAB — SURGICAL PATHOLOGY

## 2019-12-22 ENCOUNTER — Ambulatory Visit (INDEPENDENT_AMBULATORY_CARE_PROVIDER_SITE_OTHER): Payer: 59 | Admitting: Physician Assistant

## 2020-02-21 ENCOUNTER — Other Ambulatory Visit: Payer: Self-pay | Admitting: Adult Health

## 2020-02-21 DIAGNOSIS — F331 Major depressive disorder, recurrent, moderate: Secondary | ICD-10-CM

## 2020-02-21 DIAGNOSIS — F411 Generalized anxiety disorder: Secondary | ICD-10-CM

## 2020-12-06 ENCOUNTER — Other Ambulatory Visit: Payer: Self-pay

## 2020-12-06 ENCOUNTER — Encounter: Payer: Self-pay | Admitting: *Deleted

## 2020-12-06 DIAGNOSIS — D130 Benign neoplasm of esophagus: Secondary | ICD-10-CM

## 2020-12-06 NOTE — Progress Notes (Unsigned)
Opened in error

## 2020-12-07 NOTE — Progress Notes (Signed)
Charted on wrong pt.

## 2021-02-16 ENCOUNTER — Encounter: Payer: Self-pay | Admitting: *Deleted

## 2021-02-16 NOTE — Progress Notes (Signed)
Received VM from [place] with [employer] to follow up on the request for records that was sent to fax [phone] on 01/11/21. Her claim #[id number]. His phone [phone] ext [phone]. Fax# [phone] Forwarded request to HIM to f/u on sending requested records.

## 2021-05-04 ENCOUNTER — Telehealth: Payer: Self-pay | Admitting: Adult Health

## 2021-05-04 NOTE — Telephone Encounter (Signed)
Patient called requesting a refill on the Alprazolam . Patient scheduled an appointment for 10/12, only appointment was 11/02/2019.

## 2021-05-05 ENCOUNTER — Other Ambulatory Visit: Payer: Self-pay

## 2021-05-05 DIAGNOSIS — F411 Generalized anxiety disorder: Secondary | ICD-10-CM

## 2021-05-05 MED ORDER — ALPRAZOLAM 0.5 MG PO TABS: 0.5000 mg | ORAL_TABLET | Freq: Three times a day (TID) | ORAL | 0 refills | Status: DC | PRN

## 2021-05-05 NOTE — Telephone Encounter (Signed)
Pended.

## 2021-05-10 ENCOUNTER — Telehealth (INDEPENDENT_AMBULATORY_CARE_PROVIDER_SITE_OTHER): Payer: No Typology Code available for payment source | Admitting: Adult Health

## 2021-05-10 ENCOUNTER — Encounter: Payer: Self-pay | Admitting: Adult Health

## 2021-05-10 DIAGNOSIS — F331 Major depressive disorder, recurrent, moderate: Secondary | ICD-10-CM | POA: Diagnosis not present

## 2021-05-10 DIAGNOSIS — F411 Generalized anxiety disorder: Secondary | ICD-10-CM | POA: Diagnosis not present
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
## 2021-05-16 ENCOUNTER — Other Ambulatory Visit (HOSPITAL_COMMUNITY): Payer: Self-pay

## 2021-06-15 ENCOUNTER — Telehealth: Payer: No Typology Code available for payment source | Admitting: Adult Health

## 2021-06-19 ENCOUNTER — Encounter: Payer: Self-pay | Admitting: Adult Health

## 2021-06-19 ENCOUNTER — Telehealth (INDEPENDENT_AMBULATORY_CARE_PROVIDER_SITE_OTHER): Payer: No Typology Code available for payment source | Admitting: Adult Health

## 2021-06-19 DIAGNOSIS — F331 Major depressive disorder, recurrent, moderate: Secondary | ICD-10-CM | POA: Diagnosis not present

## 2021-06-19 DIAGNOSIS — F411 Generalized anxiety disorder: Secondary | ICD-10-CM

## 2021-06-19 MED ORDER — ALPRAZOLAM 1 MG PO TABS: 1.0000 mg | ORAL_TABLET | Freq: Three times a day (TID) | ORAL | 2 refills | Status: DC | PRN

## 2021-06-19 NOTE — Progress Notes (Signed)
Sandy [name] [id number] 02-Oct-1975 45 y.o.  Virtual Visit via Video Note  I connected with pt @ on 06/19/21 at  9:40 AM EST by a video enabled telemedicine application and verified that I am speaking with the correct person using two identifiers.   I discussed the limitations of evaluation and management by telemedicine and the availability of in person appointments. The patient expressed understanding and agreed to proceed.  I discussed the assessment and treatment plan with the patient. The patient was provided an opportunity to ask questions and all were answered. The patient agreed with the plan and demonstrated an understanding of the instructions.   The patient was advised to call back or seek an in-person evaluation if the symptoms worsen or if the condition fails to improve as anticipated.  I provided 10 minutes of non-face-to-face time during this encounter.  The patient was located at home.  The provider was located at [place].   Sandy Gell, NP   Subjective:   Patient ID:  Sandy [name] is a 45 y.o. (DOB 02/13/1976) female.  Chief Complaint: No chief complaint on file.   HPI Sandy [name] presents for follow-up of anxiety and depression.  Describes mood today as "ok". Pleasant. Tearful at times. Mood symptoms - reports some depression, anxiety, and irritability. More anxious overall. Increased stressors with caring for family members - mother and father. Father staying with her temporarily while he recovers from recent hospital stay. Stating "I have had a lot to deal with". Managing appointments for mother and father. 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 Varying interest and motivation. Taking medications as prescribed and feels they are working well for her. Energy levels stable. Active, does not have a regular exercise routine.  Enjoys some usual interests and activities. Single. Dating. No children. No pets. Mother in  [place]. Extended family local.  Appetite adequate. Weight loss and gain. Sleeps well most nights. Averages 6 to 8 hours. Focus and concentration stable. Completing tasks. Managing aspects of household. Working from home for [employer] for past 8 to 9 years.   Denies SI or HI.  Denies AH or VH.  Previous medication trials: Lexapro  Review of Systems:  Review of Systems  Musculoskeletal:  Negative for gait problem.  Neurological:  Negative for tremors.  Psychiatric/Behavioral:         Please refer to HPI   Medications: I have reviewed the patient's current medications.  Current Outpatient Medications  Medication Sig Dispense Refill   ALPRAZolam (XANAX) 1 MG tablet Take 1 tablet (1 mg total) by mouth 3 (three) times daily as needed. 90 tablet 2   Vitamin D, Ergocalciferol, (DRISDOL) 1.25 MG (50000 UNIT) CAPS capsule Take 1 capsule (50,000 Units total) by mouth every 7 (seven) days. 4 capsule 0   No current facility-administered medications for this visit.    Medication Side Effects: None  Allergies:  Allergies  Allergen Reactions   Percocet [Oxycodone-Acetaminophen] Itching    Patient states that she still takes this medication even with itching.    Past Medical History:  Diagnosis Date   Alcohol abuse    Anxiety    Depression    Fracture of ankle    RT   Numbness    OCASIONAL NUMBNESS RT LEG    Family History  Problem Relation Age of Onset   Healthy Mother    Healthy Father     Social History   Socioeconomic History   Marital status: Single    Spouse name:  Not on file   Number of children: Not on file   Years of education: Not on file   Highest education level: Not on file  Occupational History   Occupation: Conservation officer, nature  Tobacco Use   Smoking status: Former    Packs/day: 0.50    Years: 12.00    Pack years: 6.00    Types: Cigarettes    Quit date: 07/30/2005    Years since quitting: 15.8   Smokeless tobacco: Never  Vaping Use   Vaping  Use: Never used  Substance and Sexual Activity   Alcohol use: Yes    Comment: OCCASIONAL   Drug use: No   Sexual activity: Yes    Birth control/protection: None  Other Topics Concern   Not on file  Social History Narrative   Not on file   Social Determinants of Health   Financial Resource Strain: Not on file  Food Insecurity: Not on file  Transportation Needs: Not on file  Physical Activity: Not on file  Stress: Not on file  Social Connections: Not on file  Intimate Partner Violence: Not on file    Past Medical History, Surgical history, Social history, and Family history were reviewed and updated as appropriate.   Please see review of systems for further details on the patient's review from today.   Objective:   Physical Exam:  There were no vitals taken for this visit.  Physical Exam Constitutional:      General: She is not in acute distress. Musculoskeletal:        General: No deformity.  Neurological:     Mental Status: She is alert and oriented to person, place, and time.     Coordination: Coordination normal.  Psychiatric:        Attention and Perception: Attention and perception normal. She does not perceive auditory or visual hallucinations.        Mood and Affect: Mood normal. Mood is not anxious or depressed. Affect is not labile, blunt, angry or inappropriate.        Speech: Speech normal.        Behavior: Behavior normal.        Thought Content: Thought content normal. Thought content is not paranoid or delusional. Thought content does not include homicidal or suicidal ideation. Thought content does not include homicidal or suicidal plan.        Cognition and Memory: Cognition and memory normal.        Judgment: Judgment normal.     Comments: Insight intact    Lab Review:     Component Value Date/Time   NA 138 09/09/2019 0936   K 4.0 09/09/2019 0936   CL 100 09/09/2019 0936   CO2 21 09/09/2019 0936   GLUCOSE 87 09/09/2019 0936   GLUCOSE 95  02/04/2014 1306   BUN 6 09/09/2019 0936   CREATININE 0.72 09/09/2019 0936   CREATININE 0.65 02/04/2014 1306   CALCIUM 10.0 09/09/2019 0936   PROT 7.7 09/09/2019 0936   ALBUMIN 4.5 09/09/2019 0936   AST 31 09/09/2019 0936   ALT 32 09/09/2019 0936   ALKPHOS 60 09/09/2019 0936   BILITOT 0.4 09/09/2019 0936   GFRNONAA 104 09/09/2019 0936   GFRAA 120 09/09/2019 0936       Component Value Date/Time   WBC 7.7 09/09/2019 0936   WBC 6.2 03/03/2014 1315   RBC 5.30 (H) 09/09/2019 0936   RBC 5.41 (H) 03/03/2014 1315   HGB 12.7 09/09/2019 0936   HCT 40.3 09/09/2019 0936  PLT 350 09/09/2019 0936   MCV 76 (L) 09/09/2019 0936   MCH 24.0 (L) 09/09/2019 0936   MCH 23.5 (L) 03/03/2014 1315   MCHC 31.5 09/09/2019 0936   MCHC 32.0 03/03/2014 1315   RDW 14.8 09/09/2019 0936   LYMPHSABS 2.6 09/09/2019 0936   MONOABS 0.4 12/13/2013 0640   EOSABS 0.1 09/09/2019 0936   BASOSABS 0.0 09/09/2019 0936    No results found for: POCLITH, LITHIUM   No results found for: PHENYTOIN, PHENOBARB, VALPROATE, CBMZ   .res Assessment: Plan:    Plan:  PDMP reviewed  1. Xanax 0.5mg  to 1mg  TID temporarily  RTC 3 months  Patient advised to contact office with any questions, adverse effects, or acute worsening in signs and symptoms.  Discussed potential benefits, risk, and side effects of benzodiazepines to include potential risk of tolerance and dependence, as well as possible drowsiness.  Advised patient not to drive if experiencing drowsiness and to take lowest possible effective dose to minimize risk of dependence and tolerance. Diagnoses and all orders for this visit:  Major depressive disorder, recurrent episode, moderate ([facility])  Generalized anxiety disorder -     ALPRAZolam (XANAX) 1 MG tablet; Take 1 tablet (1 mg total) by mouth 3 (three) times daily as needed.    Please see After Visit Summary for patient specific instructions.  No future appointments.   No orders of the defined types  were placed in this encounter.     -------------------------------

## 2022-03-07 ENCOUNTER — Encounter (INDEPENDENT_AMBULATORY_CARE_PROVIDER_SITE_OTHER): Payer: Self-pay

## 2022-07-24 ENCOUNTER — Other Ambulatory Visit: Payer: Self-pay | Admitting: Internal Medicine

## 2022-07-24 DIAGNOSIS — R1084 Generalized abdominal pain: Secondary | ICD-10-CM

## 2022-08-03 ENCOUNTER — Ambulatory Visit: Payer: No Typology Code available for payment source

## 2022-09-13 ENCOUNTER — Ambulatory Visit (INDEPENDENT_AMBULATORY_CARE_PROVIDER_SITE_OTHER): Payer: Self-pay | Admitting: Adult Health

## 2022-09-13 DIAGNOSIS — F489 Nonpsychotic mental disorder, unspecified: Secondary | ICD-10-CM

## 2022-09-13 NOTE — Progress Notes (Deleted)
Sandy [name] [id number] 03/18/1976 47 y.o.  Subjective:   Patient ID:  Sandy [name] is a 47 y.o. (DOB [birth year]) female.  Chief Complaint: No chief complaint on file.   HPI Sandy [name] presents to the office today for follow-up of ***   PHQ2-9    [place] Office Visit from 09/08/2019 in [facility] at [place] from 10/23/2018 in [place] at [facility] Total Score 2 0  PHQ-9 Total Score 8 --        Review of Systems:  Review of Systems  Medications: {medication reviewed/display:3041432}  Current Outpatient Medications  Medication Sig Dispense Refill   ALPRAZolam (XANAX) 1 MG tablet Take 1 tablet (1 mg total) by mouth 3 (three) times daily as needed. 90 tablet 2   Vitamin D, Ergocalciferol, (DRISDOL) 1.25 MG (50000 UNIT) CAPS capsule Take 1 capsule (50,000 Units total) by mouth every 7 (seven) days. 4 capsule 0   No current facility-administered medications for this visit.    Medication Side Effects: {Medication Side Effects (Optional):21014029}  Allergies:  Allergies  Allergen Reactions   Percocet [Oxycodone-Acetaminophen] Itching    Patient states that she still takes this medication even with itching.    Past Medical History:  Diagnosis Date   Alcohol abuse    Anxiety    Depression    Fracture of ankle    RT   Numbness    OCASIONAL NUMBNESS RT LEG    Past Medical History, Surgical history, Social history, and Family history were reviewed and updated as appropriate.   Please see review of systems for further details on the patient's review from today.   Objective:   Physical Exam:  There were no vitals taken for this visit.  Physical Exam  Lab Review:     Component Value Date/Time   NA 138 09/09/2019 0936   K 4.0 09/09/2019 0936   CL 100 09/09/2019 0936   CO2 21 09/09/2019 0936   GLUCOSE 87 09/09/2019 0936   GLUCOSE 95 02/04/2014 1306    BUN 6 09/09/2019 0936   CREATININE 0.72 09/09/2019 0936   CREATININE 0.65 02/04/2014 1306   CALCIUM 10.0 09/09/2019 0936   PROT 7.7 09/09/2019 0936   ALBUMIN 4.5 09/09/2019 0936   AST 31 09/09/2019 0936   ALT 32 09/09/2019 0936   ALKPHOS 60 09/09/2019 0936   BILITOT 0.4 09/09/2019 0936   GFRNONAA 104 09/09/2019 0936   GFRAA 120 09/09/2019 0936       Component Value Date/Time   WBC 7.7 09/09/2019 0936   WBC 6.2 03/03/2014 1315   RBC 5.30 (H) 09/09/2019 0936   RBC 5.41 (H) 03/03/2014 1315   HGB 12.7 09/09/2019 0936   HCT 40.3 09/09/2019 0936   PLT 350 09/09/2019 0936   MCV 76 (L) 09/09/2019 0936   MCH 24.0 (L) 09/09/2019 0936   MCH 23.5 (L) 03/03/2014 1315   MCHC 31.5 09/09/2019 0936   MCHC 32.0 03/03/2014 1315   RDW 14.8 09/09/2019 0936   LYMPHSABS 2.6 09/09/2019 0936   MONOABS 0.4 12/13/2013 0640   EOSABS 0.1 09/09/2019 0936   BASOSABS 0.0 09/09/2019 0936    No results found for: "POCLITH", "LITHIUM"   No results found for: "PHENYTOIN", "PHENOBARB", "VALPROATE", "CBMZ"   .res Assessment: Plan:    There are no diagnoses linked to this encounter.   Please see After Visit Summary for patient specific instructions.  Future Appointments  Date Time Provider [place]  09/13/2022  10:00 AM Zoila Ditullio, Berdie Ogren, NP CP-CP None    No orders of the defined types were placed in this encounter.   -------------------------------

## 2022-09-13 NOTE — Progress Notes (Signed)
Patient no show appointment. ? ?

## 2022-09-14 ENCOUNTER — Encounter: Payer: Self-pay | Admitting: Adult Health

## 2022-09-14 ENCOUNTER — Telehealth: Payer: No Typology Code available for payment source | Admitting: Adult Health

## 2022-09-14 DIAGNOSIS — F331 Major depressive disorder, recurrent, moderate: Secondary | ICD-10-CM | POA: Diagnosis not present

## 2022-09-14 DIAGNOSIS — F411 Generalized anxiety disorder: Secondary | ICD-10-CM | POA: Diagnosis not present

## 2022-09-14 MED ORDER — BUPROPION HCL ER (XL) 150 MG PO TB24: 150.0000 mg | ORAL_TABLET | Freq: Every day | ORAL | 2 refills | Status: DC

## 2022-09-14 MED ORDER — ALPRAZOLAM 0.5 MG PO TABS: 0.5000 mg | ORAL_TABLET | Freq: Every day | ORAL | 2 refills | Status: DC | PRN

## 2022-09-14 NOTE — Progress Notes (Signed)
Sandy [name] [id number] Oct 15, 1975 47 y.o.  Virtual Visit via Video Note  I connected with pt @ on 09/14/22 at  9:00 AM EST by a video enabled telemedicine application and verified that I am speaking with the correct person using two identifiers.   I discussed the limitations of evaluation and management by telemedicine and the availability of in person appointments. The patient expressed understanding and agreed to proceed.  I discussed the assessment and treatment plan with the patient. The patient was provided an opportunity to ask questions and all were answered. The patient agreed with the plan and demonstrated an understanding of the instructions.   The patient was advised to call back or seek an in-person evaluation if the symptoms worsen or if the condition fails to improve as anticipated.  I provided  minutes of non-face-to-face time during this encounter.  The patient was located at home.  The provider was located at [place].   Aloha Gell, NP   Subjective:   Patient ID:  Sandy [name] is a 47 y.o. (DOB June 02, 1976) female.  Chief Complaint: No chief complaint on file.   HPI Sandy [name] presents for follow-up of anxiety and depression.   Describes mood today as "ok". Pleasant. Tearful at times. Mood symptoms - reports depression, anxiety, and irritability. More anxious overall. Reports worry, rumination, and over thinking. Mood is lower. Feels like mood decline is due to multiple situational stressors. Mother and father with  Father increased health needs. 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 Stating "I have had a lot to deal with". Has been taking Xanax as needed for anxiety, but feels like she may need to restart the Wellbutrin. Varying interest and motivation.  Energy levels stable. Active, does not have a regular exercise routine.  Enjoys some usual interests and activities. Single.  Dating. No children. No pets. Parents local. Extended family local.  Appetite adequate. Weight gain. Sleeps well most nights. Averages 7 to 8 hours.  Focus and concentration stable. Completing tasks. Managing aspects of household. Working from home for [employer] - working 10 or more hour a day. Denies SI or HI.  Denies AH or VH.  Plans to re-establish therapy with Monique Crutchfield.  Previous medication trials: Lexapro   Review of Systems:  Review of Systems  Musculoskeletal:  Negative for gait problem.  Neurological:  Negative for tremors.  Psychiatric/Behavioral:         Please refer to HPI    Medications: I have reviewed the patient's current medications.  Current Outpatient Medications  Medication Sig Dispense Refill   buPROPion (WELLBUTRIN XL) 150 MG 24 hr tablet Take 1 tablet (150 mg total) by mouth daily. 30 tablet 2   ALPRAZolam (XANAX) 0.5 MG tablet Take 1 tablet (0.5 mg total) by mouth daily as needed. 30 tablet 2   Vitamin D, Ergocalciferol, (DRISDOL) 1.25 MG (50000 UNIT) CAPS capsule Take 1 capsule (50,000 Units total) by mouth every 7 (seven) days. 4 capsule 0   No current facility-administered medications for this visit.    Medication Side Effects: None  Allergies:  Allergies  Allergen Reactions   Percocet [Oxycodone-Acetaminophen] Itching    Patient states that she still takes this medication even with itching.    Past Medical History:  Diagnosis Date   Alcohol abuse    Anxiety    Depression    Fracture of ankle    RT   Numbness    OCASIONAL NUMBNESS RT  LEG    Family History  Problem Relation Age of Onset   Healthy Mother    Healthy Father     Social History   Socioeconomic History   Marital status: Single    Spouse name: Not on file   Number of children: Not on file   Years of education: Not on file   Highest education level: Not on file  Occupational History   Occupation: Conservation officer, nature  Tobacco Use   Smoking status:  Former    Packs/day: 0.50    Years: 12.00    Total pack years: 6.00    Types: Cigarettes    Quit date: 07/30/2005    Years since quitting: 17.1   Smokeless tobacco: Never  Vaping Use   Vaping Use: Never used  Substance and Sexual Activity   Alcohol use: Yes    Comment: OCCASIONAL   Drug use: No   Sexual activity: Yes    Birth control/protection: None  Other Topics Concern   Not on file  Social History Narrative   Not on file   Social Determinants of Health   Financial Resource Strain: Not on file  Food Insecurity: Not on file  Transportation Needs: Not on file  Physical Activity: Not on file  Stress: Not on file  Social Connections: Not on file  Intimate Partner Violence: Not on file    Past Medical History, Surgical history, Social history, and Family history were reviewed and updated as appropriate.   Please see review of systems for further details on the patient's review from today.   Objective:   Physical Exam:  There were no vitals taken for this visit.  Physical Exam Constitutional:      General: She is not in acute distress. Musculoskeletal:        General: No deformity.  Neurological:     Mental Status: She is alert and oriented to person, place, and time.     Coordination: Coordination normal.  Psychiatric:        Attention and Perception: Attention and perception normal. She does not perceive auditory or visual hallucinations.        Mood and Affect: Mood normal. Mood is not anxious or depressed. Affect is not labile, blunt, angry or inappropriate.        Speech: Speech normal.        Behavior: Behavior normal.        Thought Content: Thought content normal. Thought content is not paranoid or delusional. Thought content does not include homicidal or suicidal ideation. Thought content does not include homicidal or suicidal plan.        Cognition and Memory: Cognition and memory normal.        Judgment: Judgment normal.     Comments: Insight intact      Lab Review:     Component Value Date/Time   NA 138 09/09/2019 0936   K 4.0 09/09/2019 0936   CL 100 09/09/2019 0936   CO2 21 09/09/2019 0936   GLUCOSE 87 09/09/2019 0936   GLUCOSE 95 02/04/2014 1306   BUN 6 09/09/2019 0936   CREATININE 0.72 09/09/2019 0936   CREATININE 0.65 02/04/2014 1306   CALCIUM 10.0 09/09/2019 0936   PROT 7.7 09/09/2019 0936   ALBUMIN 4.5 09/09/2019 0936   AST 31 09/09/2019 0936   ALT 32 09/09/2019 0936   ALKPHOS 60 09/09/2019 0936   BILITOT 0.4 09/09/2019 0936   GFRNONAA 104 09/09/2019 0936   GFRAA 120 09/09/2019 0936  Component Value Date/Time   WBC 7.7 09/09/2019 0936   WBC 6.2 03/03/2014 1315   RBC 5.30 (H) 09/09/2019 0936   RBC 5.41 (H) 03/03/2014 1315   HGB 12.7 09/09/2019 0936   HCT 40.3 09/09/2019 0936   PLT 350 09/09/2019 0936   MCV 76 (L) 09/09/2019 0936   MCH 24.0 (L) 09/09/2019 0936   MCH 23.5 (L) 03/03/2014 1315   MCHC 31.5 09/09/2019 0936   MCHC 32.0 03/03/2014 1315   RDW 14.8 09/09/2019 0936   LYMPHSABS 2.6 09/09/2019 0936   MONOABS 0.4 12/13/2013 0640   EOSABS 0.1 09/09/2019 0936   BASOSABS 0.0 09/09/2019 0936    No results found for: "POCLITH", "LITHIUM"   No results found for: "PHENYTOIN", "PHENOBARB", "VALPROATE", "CBMZ"   .res Assessment: Plan:    Plan:  PDMP reviewed  Add Xanax 0.42m daily as needed Add Wellbutrin XL 1540mevery morning  RTC 4 weeks   Time spent with patient was 20 minutes. Greater than 50% of face to face time with patient was spent on counseling and coordination of care.    Patient advised to contact office with any questions, adverse effects, or acute worsening in signs and symptoms.  Discussed potential benefits, risk, and side effects of benzodiazepines to include potential risk of tolerance and dependence, as well as possible drowsiness.  Advised patient not to drive if experiencing drowsiness and to take lowest possible effective dose to minimize risk of dependence and  tolerance.  Diagnoses and all orders for this visit:  Major depressive disorder, recurrent episode, moderate (HCC) -     buPROPion (WELLBUTRIN XL) 150 MG 24 hr tablet; Take 1 tablet (150 mg total) by mouth daily.  Generalized anxiety disorder -     ALPRAZolam (XANAX) 0.5 MG tablet; Take 1 tablet (0.5 mg total) by mouth daily as needed.     Please see After Visit Summary for patient specific instructions.  No future appointments.   No orders of the defined types were placed in this encounter.     -------------------------------

## 2022-11-02 ENCOUNTER — Encounter: Payer: Self-pay | Admitting: Adult Health

## 2022-11-02 ENCOUNTER — Telehealth: Payer: No Typology Code available for payment source | Admitting: Adult Health

## 2022-11-02 DIAGNOSIS — F331 Major depressive disorder, recurrent, moderate: Secondary | ICD-10-CM | POA: Diagnosis not present

## 2022-11-02 DIAGNOSIS — F411 Generalized anxiety disorder: Secondary | ICD-10-CM

## 2022-11-02 MED ORDER — ALPRAZOLAM 0.5 MG PO TABS
0.5000 mg | ORAL_TABLET | Freq: Every day | ORAL | 2 refills | Status: DC | PRN
Start: 2022-11-02 — End: 2023-02-08

## 2022-11-02 MED ORDER — BUPROPION HCL ER (XL) 150 MG PO TB24: 150.0000 mg | ORAL_TABLET | Freq: Every day | ORAL | 2 refills | Status: DC

## 2022-11-02 NOTE — Progress Notes (Signed)
Sandy [name] [id number] [birth year] 47 y.o.  Virtual Visit via Video Note  I connected with pt @ on 11/02/22 at  1:20 PM EDT by a video enabled telemedicine application and verified that I am speaking with the correct person using two identifiers.   I discussed the limitations of evaluation and management by telemedicine and the availability of in person appointments. The patient expressed understanding and agreed to proceed.  I discussed the assessment and treatment plan with the patient. The patient was provided an opportunity to ask questions and all were answered. The patient agreed with the plan and demonstrated an understanding of the instructions.   The patient was advised to call back or seek an in-person evaluation if the symptoms worsen or if the condition fails to improve as anticipated.  I provided 25 minutes of non-face-to-face time during this encounter.  The patient was located at home.  [facility]   Dorothyann Gibbsegina N Isha Seefeld, NP   Subjective:   Patient ID:  Sandy Canaryatasha Shear is a 47 y.o. (DOB [birth year]) female.  Chief Complaint: No chief complaint on file.   HPI Sandy Canaryatasha Steelman presents for follow-up of anxiety and depression.   Describes mood today as "ok". Pleasant. Tearful at times. Mood symptoms - reports decreased depression, anxiety, and irritability. Reports decreased worry, rumination, and over thinking. Still reporting situational stressors. Mood has improved - "better". Stating "I can tell a difference in my mood - still having moments". Feels like the addition of Wellbutrin has been helpful. Continues to take the Xanax as needed for anxiety. Improved interest and motivation. Taking medications as prescribed.  Energy levels improved. Active, does not have a regular exercise routine.  Enjoys some usual interests and activities. Single. Dating. No children. No pets. Parents local. Extended family local.  Appetite adequate.  Weight gain. Sleeps well most nights. Averages 7 to 8 hours.  Focus and concentration stable - feels more alert. Completing tasks. Managing aspects of household. Working from home for [employer] - working 8.5 hours a day. Denies SI or HI.  Denies AH or VH. Denies self harm. Denies substance use.  Previous medication trials: Lexapro  Review of Systems:  Review of Systems  Musculoskeletal:  Negative for gait problem.  Neurological:  Negative for tremors.  Psychiatric/Behavioral:         Please refer to HPI    Medications: I have reviewed the patient's current medications.  Current Outpatient Medications  Medication Sig Dispense Refill   ALPRAZolam (XANAX) 0.5 MG tablet Take 1 tablet (0.5 mg total) by mouth daily as needed. 30 tablet 2   buPROPion (WELLBUTRIN XL) 150 MG 24 hr tablet Take 1 tablet (150 mg total) by mouth daily. 30 tablet 2   Vitamin D, Ergocalciferol, (DRISDOL) 1.25 MG (50000 UNIT) CAPS capsule Take 1 capsule (50,000 Units total) by mouth every 7 (seven) days. 4 capsule 0   No current facility-administered medications for this visit.    Medication Side Effects: None  Allergies:  Allergies  Allergen Reactions   Percocet [Oxycodone-Acetaminophen] Itching    Patient states that she still takes this medication even with itching.    Past Medical History:  Diagnosis Date   Alcohol abuse    Anxiety    Depression    Fracture of ankle    RT   Numbness    OCASIONAL NUMBNESS RT LEG    Family History  Problem Relation Age of Onset   Healthy Mother    Healthy Father  Social History   Socioeconomic History   Marital status: Single    Spouse name: Not on file   Number of children: Not on file   Years of education: Not on file   Highest education level: Not on file  Occupational History   Occupation: Financial risk analyst  Tobacco Use   Smoking status: Former    Packs/day: 0.50    Years: 12.00    Additional pack years: 0.00    Total pack years:  6.00    Types: Cigarettes    Quit date: 07/30/2005    Years since quitting: 17.2   Smokeless tobacco: Never  Vaping Use   Vaping Use: Never used  Substance and Sexual Activity   Alcohol use: Yes    Comment: OCCASIONAL   Drug use: No   Sexual activity: Yes    Birth control/protection: None  Other Topics Concern   Not on file  Social History Narrative   Not on file   Social Determinants of Health   Financial Resource Strain: Not on file  Food Insecurity: Not on file  Transportation Needs: Not on file  Physical Activity: Not on file  Stress: Not on file  Social Connections: Not on file  Intimate Partner Violence: Not on file    Past Medical History, Surgical history, Social history, and Family history were reviewed and updated as appropriate.   Please see review of systems for further details on the patient's review from today.   Objective:   Physical Exam:  There were no vitals taken for this visit.  Physical Exam Constitutional:      General: She is not in acute distress. Musculoskeletal:        General: No deformity.  Neurological:     Mental Status: She is alert and oriented to person, place, and time.     Coordination: Coordination normal.  Psychiatric:        Attention and Perception: Attention and perception normal. She does not perceive auditory or visual hallucinations.        Mood and Affect: Mood normal. Mood is not anxious or depressed. Affect is not labile, blunt, angry or inappropriate.        Speech: Speech normal.        Behavior: Behavior normal.        Thought Content: Thought content normal. Thought content is not paranoid or delusional. Thought content does not include homicidal or suicidal ideation. Thought content does not include homicidal or suicidal plan.        Cognition and Memory: Cognition and memory normal.        Judgment: Judgment normal.     Comments: Insight intact     Lab Review:     Component Value Date/Time   NA 138  09/09/2019 0936   K 4.0 09/09/2019 0936   CL 100 09/09/2019 0936   CO2 21 09/09/2019 0936   GLUCOSE 87 09/09/2019 0936   GLUCOSE 95 02/04/2014 1306   BUN 6 09/09/2019 0936   CREATININE 0.72 09/09/2019 0936   CREATININE 0.65 02/04/2014 1306   CALCIUM 10.0 09/09/2019 0936   PROT 7.7 09/09/2019 0936   ALBUMIN 4.5 09/09/2019 0936   AST 31 09/09/2019 0936   ALT 32 09/09/2019 0936   ALKPHOS 60 09/09/2019 0936   BILITOT 0.4 09/09/2019 0936   GFRNONAA 104 09/09/2019 0936   GFRAA 120 09/09/2019 0936       Component Value Date/Time   WBC 7.7 09/09/2019 0936   WBC 6.2 03/03/2014 1315  RBC 5.30 (H) 09/09/2019 0936   RBC 5.41 (H) 03/03/2014 1315   HGB 12.7 09/09/2019 0936   HCT 40.3 09/09/2019 0936   PLT 350 09/09/2019 0936   MCV 76 (L) 09/09/2019 0936   MCH 24.0 (L) 09/09/2019 0936   MCH 23.5 (L) 03/03/2014 1315   MCHC 31.5 09/09/2019 0936   MCHC 32.0 03/03/2014 1315   RDW 14.8 09/09/2019 0936   LYMPHSABS 2.6 09/09/2019 0936   MONOABS 0.4 12/13/2013 0640   EOSABS 0.1 09/09/2019 0936   BASOSABS 0.0 09/09/2019 0936    No results found for: "POCLITH", "LITHIUM"   No results found for: "PHENYTOIN", "PHENOBARB", "VALPROATE", "CBMZ"   .res Assessment: Plan:    Plan:  PDMP reviewed  Xanax 0.5mg  daily as needed Wellbutrin XL 150mg  every morning  Therapy - Rockne Menghiniebbie Dowd  RTC 4 weeks  Time spent with patient was 10 minutes. Greater than 50% of face to face time with patient was spent on counseling and coordination of care.    Patient advised to contact office with any questions, adverse effects, or acute worsening in signs and symptoms.  Discussed potential benefits, risk, and side effects of benzodiazepines to include potential risk of tolerance and dependence, as well as possible drowsiness. Advised patient not to drive if experiencing drowsiness and to take lowest possible effective dose to minimize risk of dependence and tolerance.  There are no diagnoses linked to this  encounter.   Please see After Visit Summary for patient specific instructions.  Future Appointments  Date Time Provider Department Center  11/02/2022  1:20 PM Meggin Ola, Thereasa Soloegina Nattalie, NP [facility] None    No orders of the defined types were placed in this encounter.     -------------------------------

## 2023-01-04 ENCOUNTER — Ambulatory Visit (INDEPENDENT_AMBULATORY_CARE_PROVIDER_SITE_OTHER): Payer: Self-pay | Admitting: Adult Health

## 2023-01-04 DIAGNOSIS — Z0389 Encounter for observation for other suspected diseases and conditions ruled out: Secondary | ICD-10-CM

## 2023-01-04 NOTE — Progress Notes (Signed)
Patient no show appointment. ? ?

## 2023-01-30 ENCOUNTER — Telehealth: Payer: Self-pay

## 2023-01-30 NOTE — Telephone Encounter (Signed)
Please call patient and schedule an appt with Tamela Oddi and with Almira Coaster.

## 2023-01-30 NOTE — Telephone Encounter (Signed)
That is fine 

## 2023-01-30 NOTE — Telephone Encounter (Signed)
Patient called crying, saying she was overwhelmed with work and family stress and wanted to see a Veterinary surgeon. She was a no show last appt. Your last note mentions Rockne Menghini, but I don't see that she ever had an appt scheduled with her.  I told her we had a new therapist and her schedule was more open currently that we could probably get her in fairly quickly.   She said she is compliant with meds, Wellbutrin and Xanax, but based on RF dates I question if she is compliant with Wellbutrin. Last filled alprazolam 4/20.   She reports sometimes having suicidal thoughts. I provided emergency resources and she said she doesn't want to do that, just wants to talk to someone.  She was last seen by you on 4/5, was a no show on 6/7, and does not currently have an appt scheduled. I will have admin call to schedule an appt with you and with Tamela Oddi if you feel that is appropriate.

## 2023-02-08 ENCOUNTER — Telehealth: Payer: No Typology Code available for payment source | Admitting: Adult Health

## 2023-02-08 ENCOUNTER — Encounter: Payer: Self-pay | Admitting: Adult Health

## 2023-02-08 DIAGNOSIS — F331 Major depressive disorder, recurrent, moderate: Secondary | ICD-10-CM | POA: Diagnosis not present

## 2023-02-08 DIAGNOSIS — F411 Generalized anxiety disorder: Secondary | ICD-10-CM | POA: Diagnosis not present

## 2023-02-08 MED ORDER — BUPROPION HCL ER (XL) 150 MG PO TB24: 150.0000 mg | ORAL_TABLET | Freq: Every day | ORAL | 2 refills | Status: DC

## 2023-02-08 MED ORDER — ALPRAZOLAM 0.5 MG PO TABS
0.5000 mg | ORAL_TABLET | Freq: Every day | ORAL | 2 refills | Status: DC | PRN
Start: 2023-02-08 — End: 2023-08-30

## 2023-02-08 NOTE — Progress Notes (Signed)
Sandy [name] [id number] 11/13/1975 47 y.o.  Virtual Visit via Video Note  I connected with pt @ on 02/08/23 at  3:00 PM EDT by a video enabled telemedicine application and verified that I am speaking with the correct person using two identifiers.   I discussed the limitations of evaluation and management by telemedicine and the availability of in person appointments. The patient expressed understanding and agreed to proceed.  I discussed the assessment and treatment plan with the patient. The patient was provided an opportunity to ask questions and all were answered. The patient agreed with the plan and demonstrated an understanding of the instructions.   The patient was advised to call back or seek an in-person evaluation if the symptoms worsen or if the condition fails to improve as anticipated.  I provided 10 minutes of non-face-to-face time during this encounter.  The patient was located at home.  The provider was located at [facility].   Dorothyann Gibbs, NP   Subjective:   Patient ID:  Sandy [name] is a 47 y.o. (DOB 12-24-1975) female.  Chief Complaint: No chief complaint on file.   HPI Jameah Roubideaux presents for follow-up of anxiety and depression.   Describes mood today as "not too good". Pleasant. Tearful at times. Mood symptoms - reports depression, anxiety, and irritability. Reports worry, rumination, and over thinking. Report increased stressors in the work setting and her parents. Mood has declined. Stating "I'm not doing as well as I was". Reports stopping the Wellbutrin since last visit. Reports she started taking Amino acids and felt better initially. Continues to take the Xanax as needed for anxiety. Improved interest and motivation. Taking medications as prescribed.  Energy levels improved. Active, does not have a regular exercise routine.  Enjoys some usual interests and activities. Single. Dating. No children. No pets. Parents local. Extended  family local.  Appetite adequate. Weight gain. Reports difficulties with sleep. Averages 5 hours - waking up in the middle of the night.  Focus and concentration more sporadic. Completing tasks. Managing aspects of household. Working from home for [employer] - working 8.5 hours a day. Denies SI or HI.  Denies AH or VH. Denies self harm. Denies substance use.  Previous medication trials: Lexapro  Review of Systems:  Review of Systems  Musculoskeletal:  Negative for gait problem.  Neurological:  Negative for tremors.  Psychiatric/Behavioral:         Please refer to HPI    Medications: I have reviewed the patient's current medications.  Current Outpatient Medications  Medication Sig Dispense Refill   ALPRAZolam (XANAX) 0.5 MG tablet Take 1 tablet (0.5 mg total) by mouth daily as needed. 30 tablet 2   buPROPion (WELLBUTRIN XL) 150 MG 24 hr tablet Take 1 tablet (150 mg total) by mouth daily. 30 tablet 2   Vitamin D, Ergocalciferol, (DRISDOL) 1.25 MG (50000 UNIT) CAPS capsule Take 1 capsule (50,000 Units total) by mouth every 7 (seven) days. 4 capsule 0   No current facility-administered medications for this visit.    Medication Side Effects: None  Allergies:  Allergies  Allergen Reactions   Percocet [Oxycodone-Acetaminophen] Itching    Patient states that she still takes this medication even with itching.    Past Medical History:  Diagnosis Date   Alcohol abuse    Anxiety    Depression    Fracture of ankle    RT   Numbness    OCASIONAL NUMBNESS RT LEG    Family History  Problem Relation Age of  Onset   Healthy Mother    Healthy Father     Social History   Socioeconomic History   Marital status: Single    Spouse name: Not on file   Number of children: Not on file   Years of education: Not on file   Highest education level: Not on file  Occupational History   Occupation: Financial risk analyst  Tobacco Use   Smoking status: Former    Current packs/day: 0.00     Average packs/day: 0.5 packs/day for 12.0 years (6.0 ttl pk-yrs)    Types: Cigarettes    Start date: 07/30/1993    Quit date: 07/30/2005    Years since quitting: 17.5   Smokeless tobacco: Never  Vaping Use   Vaping status: Never Used  Substance and Sexual Activity   Alcohol use: Yes    Comment: OCCASIONAL   Drug use: No   Sexual activity: Yes    Birth control/protection: None  Other Topics Concern   Not on file  Social History Narrative   Not on file   Social Determinants of Health   Financial Resource Strain: Not on file  Food Insecurity: Not on file  Transportation Needs: Not on file  Physical Activity: Not on file  Stress: Not on file  Social Connections: Not on file  Intimate Partner Violence: Not on file    Past Medical History, Surgical history, Social history, and Family history were reviewed and updated as appropriate.   Please see review of systems for further details on the patient's review from today.   Objective:   Physical Exam:  There were no vitals taken for this visit.  Physical Exam Constitutional:      General: She is not in acute distress. Musculoskeletal:        General: No deformity.  Neurological:     Mental Status: She is alert and oriented to person, place, and time.     Coordination: Coordination normal.  Psychiatric:        Attention and Perception: Attention and perception normal. She does not perceive auditory or visual hallucinations.        Mood and Affect: Affect is not labile, blunt, angry or inappropriate.        Speech: Speech normal.        Behavior: Behavior normal.        Thought Content: Thought content normal. Thought content is not paranoid or delusional. Thought content does not include homicidal or suicidal ideation. Thought content does not include homicidal or suicidal plan.        Cognition and Memory: Cognition and memory normal.        Judgment: Judgment normal.     Comments: Insight intact     Lab Review:      Component Value Date/Time   NA 138 09/09/2019 0936   K 4.0 09/09/2019 0936   CL 100 09/09/2019 0936   CO2 21 09/09/2019 0936   GLUCOSE 87 09/09/2019 0936   GLUCOSE 95 02/04/2014 1306   BUN 6 09/09/2019 0936   CREATININE 0.72 09/09/2019 0936   CREATININE 0.65 02/04/2014 1306   CALCIUM 10.0 09/09/2019 0936   PROT 7.7 09/09/2019 0936   ALBUMIN 4.5 09/09/2019 0936   AST 31 09/09/2019 0936   ALT 32 09/09/2019 0936   ALKPHOS 60 09/09/2019 0936   BILITOT 0.4 09/09/2019 0936   GFRNONAA 104 09/09/2019 0936   GFRAA 120 09/09/2019 0936       Component Value Date/Time   WBC 7.7 09/09/2019 0936  WBC 6.2 03/03/2014 1315   RBC 5.30 (H) 09/09/2019 0936   RBC 5.41 (H) 03/03/2014 1315   HGB 12.7 09/09/2019 0936   HCT 40.3 09/09/2019 0936   PLT 350 09/09/2019 0936   MCV 76 (L) 09/09/2019 0936   MCH 24.0 (L) 09/09/2019 0936   MCH 23.5 (L) 03/03/2014 1315   MCHC 31.5 09/09/2019 0936   MCHC 32.0 03/03/2014 1315   RDW 14.8 09/09/2019 0936   LYMPHSABS 2.6 09/09/2019 0936   MONOABS 0.4 12/13/2013 0640   EOSABS 0.1 09/09/2019 0936   BASOSABS 0.0 09/09/2019 0936    No results found for: "POCLITH", "LITHIUM"   No results found for: "PHENYTOIN", "PHENOBARB", "VALPROATE", "CBMZ"   .res Assessment: Plan:    Plan:  PDMP reviewed  Xanax 0.5mg  daily as needed  Wellbutrin XL 150mg  every morning  Therapy - Rockne Menghini  RTC 4 weeks  Time spent with patient was 10 minutes. Greater than 50% of face to face time with patient was spent on counseling and coordination of care.    Patient advised to contact office with any questions, adverse effects, or acute worsening in signs and symptoms.  Discussed potential benefits, risk, and side effects of benzodiazepines to include potential risk of tolerance and dependence, as well as possible drowsiness. Advised patient not to drive if experiencing drowsiness and to take lowest possible effective dose to minimize risk of dependence and  tolerance.  There are no diagnoses linked to this encounter.   Please see After Visit Summary for patient specific instructions.  Future Appointments  Date Time Provider Department Center  02/08/2023  3:00 PM Emmajane Altamura, Thereasa Solo, NP CP-CP None  02/13/2023  4:00 PM Gaspar Bidding, [facility] None    No orders of the defined types were placed in this encounter.     -------------------------------

## 2023-02-13 ENCOUNTER — Ambulatory Visit: Payer: No Typology Code available for payment source | Admitting: Professional Counselor

## 2023-03-15 ENCOUNTER — Ambulatory Visit: Payer: No Typology Code available for payment source | Admitting: Professional Counselor

## 2023-03-22 ENCOUNTER — Ambulatory Visit (INDEPENDENT_AMBULATORY_CARE_PROVIDER_SITE_OTHER): Payer: No Typology Code available for payment source | Admitting: Professional Counselor

## 2023-03-22 ENCOUNTER — Encounter: Payer: Self-pay | Admitting: Professional Counselor

## 2023-03-22 DIAGNOSIS — F3341 Major depressive disorder, recurrent, in partial remission: Secondary | ICD-10-CM

## 2023-03-22 DIAGNOSIS — F411 Generalized anxiety disorder: Secondary | ICD-10-CM | POA: Diagnosis not present
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
## 2023-04-16 ENCOUNTER — Ambulatory Visit: Payer: No Typology Code available for payment source

## 2023-08-30 ENCOUNTER — Telehealth: Payer: Self-pay | Admitting: Adult Health

## 2023-08-30 ENCOUNTER — Encounter: Payer: Self-pay | Admitting: Adult Health

## 2023-08-30 ENCOUNTER — Telehealth (INDEPENDENT_AMBULATORY_CARE_PROVIDER_SITE_OTHER): Payer: No Typology Code available for payment source | Admitting: Adult Health

## 2023-08-30 DIAGNOSIS — F32A Depression, unspecified: Secondary | ICD-10-CM | POA: Diagnosis not present

## 2023-08-30 DIAGNOSIS — F411 Generalized anxiety disorder: Secondary | ICD-10-CM

## 2023-08-30 DIAGNOSIS — F419 Anxiety disorder, unspecified: Secondary | ICD-10-CM

## 2023-08-30 MED ORDER — ALPRAZOLAM 0.5 MG PO TABS: 0.5000 mg | ORAL_TABLET | Freq: Every day | ORAL | 2 refills | Status: DC | PRN

## 2023-08-30 NOTE — Progress Notes (Signed)
Sandy [name] [id number] 1975-08-01 48 y.o.  Virtual Visit via Video Note  I connected with pt @ on 08/30/23 at  2:30 PM EST by a video enabled telemedicine application and verified that I am speaking with the correct person using two identifiers.   I discussed the limitations of evaluation and management by telemedicine and the availability of in person appointments. The patient expressed understanding and agreed to proceed.  I discussed the assessment and treatment plan with the patient. The patient was provided an opportunity to ask questions and all were answered. The patient agreed with the plan and demonstrated an understanding of the instructions.   The patient was advised to call back or seek an in-person evaluation if the symptoms worsen or if the condition fails to improve as anticipated.  I provided 15 minutes of non-face-to-face time during this encounter.  The patient was located at home.  The provider was located at [facility].   Dorothyann Gibbs, NP   Subjective:   Patient ID:  Sandy [name] is a 48 y.o. (DOB 05/16/1976) female.  Chief Complaint: No chief complaint on file.   HPI Sandy [name] presents for follow-up of anxiety and depression.   Describes mood today as "ok". Pleasant. Tearful at times. Mood symptoms - reports decreased depression and irritability. Reports situational anxiety. Reports worry, rumination, and over thinking - sometimes, but has gotten better. Report  stressors in the work setting. Concerned about aging parents. Mood is stable. Stating "I feel like I'm doing alright". Continues to take the Xanax as needed for anxiety. Improved interest and motivation. Taking medications as prescribed.  Energy levels improved. Active, does not have a regular exercise routine.  Enjoys some usual interests and activities. Single. Dating. No children. No pets. Parents local. Extended family local.  Appetite adequate. Weight loss - 14  pounds. Sleeping well most nights. Averages 7 to 8 hours. Focus and concentration stable. Completing tasks. Managing aspects of household. Working from home for [employer] - working 8.5 hours a day. Denies SI or HI.  Denies AH or VH. Denies self harm. Denies substance use.  Previous medication trials: Lexapro   Review of Systems:  Review of Systems  Musculoskeletal:  Negative for gait problem.  Neurological:  Negative for tremors.  Psychiatric/Behavioral:         Please refer to HPI    Medications: I have reviewed the patient's current medications.  Current Outpatient Medications  Medication Sig Dispense Refill   ALPRAZolam (XANAX) 0.5 MG tablet Take 1 tablet (0.5 mg total) by mouth daily as needed. 30 tablet 2   buPROPion (WELLBUTRIN XL) 150 MG 24 hr tablet Take 1 tablet (150 mg total) by mouth daily. 30 tablet 2   Vitamin D, Ergocalciferol, (DRISDOL) 1.25 MG (50000 UNIT) CAPS capsule Take 1 capsule (50,000 Units total) by mouth every 7 (seven) days. 4 capsule 0   No current facility-administered medications for this visit.    Medication Side Effects: None  Allergies:  Allergies  Allergen Reactions   Percocet [Oxycodone-Acetaminophen] Itching    Patient states that she still takes this medication even with itching.    Past Medical History:  Diagnosis Date   Alcohol abuse    Anxiety    Depression    Fracture of ankle    RT   Numbness    OCASIONAL NUMBNESS RT LEG    Family History  Problem Relation Age of Onset   Healthy Mother    Healthy Father     Social History  Socioeconomic History   Marital status: Single    Spouse name: Not on file   Number of children: Not on file   Years of education: Not on file   Highest education level: Not on file  Occupational History   Occupation: Financial risk analyst  Tobacco Use   Smoking status: Former    Current packs/day: 0.00    Average packs/day: 0.5 packs/day for 12.0 years (6.0 ttl pk-yrs)    Types:  Cigarettes    Start date: 07/30/1993    Quit date: 07/30/2005    Years since quitting: 18.0   Smokeless tobacco: Never  Vaping Use   Vaping status: Never Used  Substance and Sexual Activity   Alcohol use: Yes    Comment: OCCASIONAL   Drug use: No   Sexual activity: Yes    Birth control/protection: None  Other Topics Concern   Not on file  Social History Narrative   Not on file   Social Drivers of Health   Financial Resource Strain: Low Risk  (07/08/2023)   Received from [facility]   Overall Financial Resource Strain (CARDIA)    Difficulty of Paying Living Expenses: Not hard at all  Food Insecurity: No Food Insecurity (07/08/2023)   [facility]   Hunger Vital Sign    Worried About Running Out of Food in the Last Year: Never true    Ran Out of Food in the Last Year: Never true  Transportation Needs: No Transportation Needs (07/08/2023)   Received from [facility]    In the past 12 months, has lack of transportation kept you from medical appointments or from getting medications?: No    Lack of Transportation (Non-Medical): No  Physical Activity: Not on file  Stress: Not on file  Social Connections: Not on file  Intimate Partner Violence: Not on file    Past Medical History, Surgical history, Social history, and Family history were reviewed and updated as appropriate.   Please see review of systems for further details on the patient's review from today.   Objective:   Physical Exam:  There were no vitals taken for this visit.  Physical Exam Constitutional:      General: She is not in acute distress. Musculoskeletal:        General: No deformity.  Neurological:     Mental Status: She is alert and oriented to person, place, and time.     Coordination: Coordination normal.  Psychiatric:        Attention and Perception: Attention and perception normal. She does not perceive  auditory or visual hallucinations.        Mood and Affect: Mood normal. Mood is not anxious or depressed. Affect is not labile, blunt, angry or inappropriate.        Speech: Speech normal.        Behavior: Behavior normal.        Thought Content: Thought content normal. Thought content is not paranoid or delusional. Thought content does not include homicidal or suicidal ideation. Thought content does not include homicidal or suicidal plan.        Cognition and Memory: Cognition and memory normal.        Judgment: Judgment normal.     Comments: Insight intact     Lab Review:     Component Value Date/Time   NA 138 09/09/2019 0936   K 4.0 09/09/2019 0936   CL 100 09/09/2019 0936  CO2 21 09/09/2019 0936   GLUCOSE 87 09/09/2019 0936   GLUCOSE 95 02/04/2014 1306   BUN 6 09/09/2019 0936   CREATININE 0.72 09/09/2019 0936   CREATININE 0.65 02/04/2014 1306   CALCIUM 10.0 09/09/2019 0936   PROT 7.7 09/09/2019 0936   ALBUMIN 4.5 09/09/2019 0936   AST 31 09/09/2019 0936   ALT 32 09/09/2019 0936   ALKPHOS 60 09/09/2019 0936   BILITOT 0.4 09/09/2019 0936   GFRNONAA 104 09/09/2019 0936   GFRAA 120 09/09/2019 0936       Component Value Date/Time   WBC 7.7 09/09/2019 0936   WBC 6.2 03/03/2014 1315   RBC 5.30 (H) 09/09/2019 0936   RBC 5.41 (H) 03/03/2014 1315   HGB 12.7 09/09/2019 0936   HCT 40.3 09/09/2019 0936   PLT 350 09/09/2019 0936   MCV 76 (L) 09/09/2019 0936   MCH 24.0 (L) 09/09/2019 0936   MCH 23.5 (L) 03/03/2014 1315   MCHC 31.5 09/09/2019 0936   MCHC 32.0 03/03/2014 1315   RDW 14.8 09/09/2019 0936   LYMPHSABS 2.6 09/09/2019 0936   MONOABS 0.4 12/13/2013 0640   EOSABS 0.1 09/09/2019 0936   BASOSABS 0.0 09/09/2019 0936    No results found for: "POCLITH", "LITHIUM"   No results found for: "PHENYTOIN", "PHENOBARB", "VALPROATE", "CBMZ"   .res Assessment: Plan:    Plan:  PDMP reviewed  Xanax 0.5mg  daily as needed  Therapy - Rockne Menghini  RTC 3 months  Patient  advised to contact office with any questions, adverse effects, or acute worsening in signs and symptoms.  15 minutes spent dedicated to the care of this patient on the date of this encounter to include pre-visit review of records, ordering of medication, post visit documentation, and face-to-face time with the patient discussing anxiety and depression.  Discussed continuing current medication regimen.  Discussed potential benefits, risk, and side effects of benzodiazepines to include potential risk of tolerance and dependence, as well as possible drowsiness. Advised patient not to drive if experiencing drowsiness and to take lowest possible effective dose to minimize risk of dependence and tolerance.  There are no diagnoses linked to this encounter.   Please see After Visit Summary for patient specific instructions.  Future Appointments  Date Time Provider Department Center  08/30/2023  2:30 PM Takako Minckler, Thereasa Solo, NP CP-CP None    No orders of the defined types were placed in this encounter.     -------------------------------

## 2023-10-25 NOTE — Telephone Encounter (Signed)
 Error

## 2023-11-25 ENCOUNTER — Other Ambulatory Visit: Payer: Self-pay | Admitting: Obstetrics and Gynecology

## 2023-12-16 ENCOUNTER — Encounter (HOSPITAL_COMMUNITY): Payer: Self-pay | Admitting: Obstetrics and Gynecology

## 2023-12-17 ENCOUNTER — Encounter (HOSPITAL_COMMUNITY): Payer: Self-pay | Admitting: Obstetrics and Gynecology

## 2023-12-17 NOTE — Progress Notes (Signed)
 Spoke w/ via phone for pre-op interview--- pt Lab needs dos----      cbc, urine preg   Lab results------ no COVID test -----patient states asymptomatic no test needed Arrive at ------- 1000 on 12-19-2023 NPO after MN NO Solid Food.  Clear liquids from MN until--- 0900 Pre-Surgery Ensure or G2: n/a  Med rec completed Medications to take morning of surgery ----- xanax  if needed  Diabetic medication ----- n/a  GLP1 agonist last dose: pt stated last dose 11-12-2023 GLP1 instructions: pt aware not to do dose day of surgery  Patient instructed no nail polish to be worn day of surgery Patient instructed to bring photo id and insurance card day of surgery Patient aware to have Driver (ride ) / caregiver    for 24 hours after surgery - mother, diane boulware/ friend, amoury bowden Pt stated plan is she will drive and park car in deck and either her or friend will later to discharge  Pre-op # given for them to call when they arrive in hospital Patient Special Instructions -----  shower w/ antibacterial soap Pre-Op special Instructions ----- n/a  Patient verbalized understanding of instructions that were given at this phone interview. Patient denies chest pain, sob, fever, cough at the interview.

## 2023-12-19 ENCOUNTER — Encounter (HOSPITAL_COMMUNITY)
Admission: RE | Disposition: A | Discharge status: Home / Self Care | Payer: Self-pay | Source: Home / Self Care | Attending: Obstetrics and Gynecology

## 2023-12-19 ENCOUNTER — Other Ambulatory Visit: Payer: Self-pay

## 2023-12-19 ENCOUNTER — Ambulatory Visit (HOSPITAL_COMMUNITY)

## 2023-12-19 ENCOUNTER — Encounter (HOSPITAL_COMMUNITY): Payer: Self-pay | Admitting: Obstetrics and Gynecology

## 2023-12-19 ENCOUNTER — Ambulatory Visit (HOSPITAL_BASED_OUTPATIENT_CLINIC_OR_DEPARTMENT_OTHER)

## 2023-12-19 ENCOUNTER — Ambulatory Visit (HOSPITAL_COMMUNITY)
Admission: RE | Admit: 2023-12-19 | Discharge: 2023-12-19 | Disposition: A | Discharge status: Home / Self Care | Attending: Obstetrics and Gynecology | Admitting: Obstetrics and Gynecology

## 2023-12-19 DIAGNOSIS — D251 Intramural leiomyoma of uterus: Secondary | ICD-10-CM | POA: Insufficient documentation

## 2023-12-19 DIAGNOSIS — Z87891 Personal history of nicotine dependence: Secondary | ICD-10-CM | POA: Diagnosis not present

## 2023-12-19 DIAGNOSIS — D250 Submucous leiomyoma of uterus: Secondary | ICD-10-CM | POA: Diagnosis not present

## 2023-12-19 DIAGNOSIS — D252 Subserosal leiomyoma of uterus: Secondary | ICD-10-CM | POA: Insufficient documentation

## 2023-12-19 DIAGNOSIS — D259 Leiomyoma of uterus, unspecified: Secondary | ICD-10-CM | POA: Diagnosis not present

## 2023-12-19 DIAGNOSIS — Z01818 Encounter for other preprocedural examination: Secondary | ICD-10-CM

## 2023-12-19 DIAGNOSIS — N920 Excessive and frequent menstruation with regular cycle: Secondary | ICD-10-CM | POA: Insufficient documentation

## 2023-12-19 HISTORY — DX: Generalized anxiety disorder: F41.1

## 2023-12-19 HISTORY — DX: Major depressive disorder, single episode, unspecified: F32.9

## 2023-12-19 HISTORY — DX: Deficiency of other specified B group vitamins: E53.8

## 2023-12-19 HISTORY — DX: Prediabetes: R73.03

## 2023-12-19 HISTORY — PX: HYSTEROSCOPY WITH D & C: SHX1775

## 2023-12-19 HISTORY — DX: Alcohol abuse, in remission: F10.11

## 2023-12-19 HISTORY — DX: Hyperlipidemia, unspecified: E78.5

## 2023-12-19 HISTORY — DX: Myoneural disorder, unspecified: G70.9

## 2023-12-19 HISTORY — PX: RADIOFREQUENCY ABLATION, LEIOMYOMA, UTERUS, TRANSCERVICAL APPROACH, WITH US GUIDANCE: SHX7624

## 2023-12-19 HISTORY — DX: Vitamin D deficiency, unspecified: E55.9

## 2023-12-19 HISTORY — DX: Leiomyoma of uterus, unspecified: D25.9

## 2023-12-19 LAB — CBC
HCT: 42.2 % (ref 36.0–46.0)
Hemoglobin: 12.6 g/dL (ref 12.0–15.0)
MCH: 23.6 pg — ABNORMAL LOW (ref 26.0–34.0)
MCHC: 29.9 g/dL — ABNORMAL LOW (ref 30.0–36.0)
MCV: 79.2 fL — ABNORMAL LOW (ref 80.0–100.0)
Platelets: 323 10*3/uL (ref 150–400)
RBC: 5.33 MIL/uL — ABNORMAL HIGH (ref 3.87–5.11)
RDW: 14.5 % (ref 11.5–15.5)
WBC: 8.6 10*3/uL (ref 4.0–10.5)
nRBC: 0 % (ref 0.0–0.2)

## 2023-12-19 LAB — POCT PREGNANCY, URINE: Preg Test, Ur: NEGATIVE

## 2023-12-19 SURGERY — RADIOFREQUENCY ABLATION, LEIOMYOMA, UTERUS, TRANSCERVICAL APPROACH, WITH US GUIDANCE
Anesthesia: General | Site: Vagina

## 2023-12-19 MED ORDER — POVIDONE-IODINE 10 % EX SWAB
2.0000 | Freq: Once | CUTANEOUS | Status: DC
Start: 2023-12-19 — End: 2023-12-19

## 2023-12-19 MED ORDER — PROPOFOL 10 MG/ML IV BOLUS
INTRAVENOUS | Status: DC | PRN
  Administered 2023-12-19: 200 mg via INTRAVENOUS

## 2023-12-19 MED ORDER — FENTANYL CITRATE (PF) 250 MCG/5ML IJ SOLN
INTRAMUSCULAR | Status: DC | PRN
Start: 2023-12-19 — End: 2023-12-19
  Administered 2023-12-19 (×2): 100 ug via INTRAVENOUS
  Administered 2023-12-19: 50 ug via INTRAVENOUS

## 2023-12-19 MED ORDER — IBUPROFEN 800 MG PO TABS: 800.0000 mg | ORAL_TABLET | Freq: Four times a day (QID) | ORAL | 3 refills | Status: AC | PRN

## 2023-12-19 MED ORDER — MIDAZOLAM HCL 2 MG/2ML IJ SOLN
INTRAMUSCULAR | Status: AC
  Filled 2023-12-19: qty 2

## 2023-12-19 MED ORDER — ROCURONIUM BROMIDE 10 MG/ML (PF) SYRINGE
PREFILLED_SYRINGE | INTRAVENOUS | Status: AC
  Filled 2023-12-19: qty 10

## 2023-12-19 MED ORDER — PROPOFOL 10 MG/ML IV BOLUS
INTRAVENOUS | Status: AC
  Filled 2023-12-19: qty 20

## 2023-12-19 MED ORDER — LIDOCAINE 2% (20 MG/ML) 5 ML SYRINGE
INTRAMUSCULAR | Status: DC | PRN
  Administered 2023-12-19: 80 mg via INTRAVENOUS

## 2023-12-19 MED ORDER — ACETAMINOPHEN 10 MG/ML IV SOLN
INTRAVENOUS | Status: AC
  Filled 2023-12-19: qty 100

## 2023-12-19 MED ORDER — ONDANSETRON HCL 4 MG/2ML IJ SOLN
INTRAMUSCULAR | Status: DC | PRN
  Administered 2023-12-19: 4 mg via INTRAVENOUS

## 2023-12-19 MED ORDER — PHENYLEPHRINE 80 MCG/ML (10ML) SYRINGE FOR IV PUSH (FOR BLOOD PRESSURE SUPPORT)
PREFILLED_SYRINGE | INTRAVENOUS | Status: DC | PRN
  Administered 2023-12-19 (×2): 80 ug via INTRAVENOUS

## 2023-12-19 MED ORDER — ONDANSETRON HCL 4 MG/2ML IJ SOLN
INTRAMUSCULAR | Status: AC
  Filled 2023-12-19: qty 2

## 2023-12-19 MED ORDER — SODIUM CHLORIDE 0.9 % IR SOLN
Status: DC | PRN
Start: 2023-12-19 — End: 2023-12-19
  Administered 2023-12-19: 3000 mL

## 2023-12-19 MED ORDER — FENTANYL CITRATE (PF) 100 MCG/2ML IJ SOLN
INTRAMUSCULAR | Status: AC
  Filled 2023-12-19: qty 2

## 2023-12-19 MED ORDER — LACTATED RINGERS IV SOLN: INTRAVENOUS | Status: DC

## 2023-12-19 MED ORDER — ACETAMINOPHEN 10 MG/ML IV SOLN
INTRAVENOUS | Status: DC | PRN
  Administered 2023-12-19: 1000 mg via INTRAVENOUS

## 2023-12-19 MED ORDER — FENTANYL CITRATE (PF) 250 MCG/5ML IJ SOLN
INTRAMUSCULAR | Status: AC
Start: 2023-12-19 — End: ?
  Filled 2023-12-19: qty 5

## 2023-12-19 MED ORDER — DEXAMETHASONE SODIUM PHOSPHATE 10 MG/ML IJ SOLN
INTRAMUSCULAR | Status: DC | PRN
Start: 2023-12-19 — End: 2023-12-19
  Administered 2023-12-19: 10 mg via INTRAVENOUS

## 2023-12-19 MED ORDER — FENTANYL CITRATE (PF) 100 MCG/2ML IJ SOLN
25.0000 ug | INTRAMUSCULAR | Status: DC | PRN
  Administered 2023-12-19: 25 ug via INTRAVENOUS
  Administered 2023-12-19: 50 ug via INTRAVENOUS

## 2023-12-19 MED ORDER — KETOROLAC TROMETHAMINE 30 MG/ML IJ SOLN
INTRAMUSCULAR | Status: DC | PRN
  Administered 2023-12-19: 30 mg via INTRAVENOUS

## 2023-12-19 MED ORDER — CHLORHEXIDINE GLUCONATE 0.12 % MT SOLN
OROMUCOSAL | Status: AC
  Filled 2023-12-19: qty 15

## 2023-12-19 MED ORDER — ORAL CARE MOUTH RINSE: 15.0000 mL | Freq: Once | OROMUCOSAL | Status: AC

## 2023-12-19 MED ORDER — CHLORHEXIDINE GLUCONATE 0.12 % MT SOLN
15.0000 mL | Freq: Once | OROMUCOSAL | Status: AC
  Administered 2023-12-19: 15 mL via OROMUCOSAL

## 2023-12-19 MED ORDER — LIDOCAINE 2% (20 MG/ML) 5 ML SYRINGE
INTRAMUSCULAR | Status: AC
  Filled 2023-12-19: qty 5

## 2023-12-19 MED ORDER — ACETAMINOPHEN 10 MG/ML IV SOLN: 1000.0000 mg | Freq: Once | INTRAVENOUS | Status: DC | PRN

## 2023-12-19 MED ORDER — DEXAMETHASONE SODIUM PHOSPHATE 10 MG/ML IJ SOLN
INTRAMUSCULAR | Status: AC
Start: 2023-12-19 — End: ?
  Filled 2023-12-19: qty 1

## 2023-12-19 MED ORDER — DROPERIDOL 2.5 MG/ML IJ SOLN: 0.6250 mg | Freq: Once | INTRAMUSCULAR | Status: DC | PRN

## 2023-12-19 MED ORDER — STERILE WATER FOR IRRIGATION IR SOLN
Status: DC | PRN
  Administered 2023-12-19: 1000 mL

## 2023-12-19 MED ORDER — MIDAZOLAM HCL 2 MG/2ML IJ SOLN
INTRAMUSCULAR | Status: DC | PRN
  Administered 2023-12-19: 2 mg via INTRAVENOUS

## 2023-12-19 SURGICAL SUPPLY — 20 items
COVER MAYO STAND STRL (DRAPES) ×3 IMPLANT
DILATOR CANAL MILEX (MISCELLANEOUS) ×1 IMPLANT
ELECT DISPERSIVE SONATA (ELECTRODE) ×8 IMPLANT
GLOVE BIOGEL PI IND STRL 7.0 (GLOVE) ×5 IMPLANT
GLOVE ECLIPSE 6.5 STRL STRAW (GLOVE) ×4 IMPLANT
GLOVE SURG SS PI 6.0 STRL IVOR (GLOVE) ×2 IMPLANT
GLOVE SURG UNDER POLY LF SZ7 (GLOVE) ×8 IMPLANT
GOWN STRL REUS W/ TWL LRG LVL3 (GOWN DISPOSABLE) ×9 IMPLANT
HANDPIECE RFA SONATA (MISCELLANEOUS) ×4 IMPLANT
KIT PROCEDURE FLUENT (KITS) ×3 IMPLANT
KIT TURNOVER KIT B (KITS) ×4 IMPLANT
PACK VAGINAL MINOR WOMEN LF (CUSTOM PROCEDURE TRAY) ×4 IMPLANT
PAD OB MATERNITY 11 LF (PERSONAL CARE ITEMS) ×4 IMPLANT
SEAL ROD LENS SCOPE MYOSURE (ABLATOR) ×4 IMPLANT
SOL .9 NS 3000ML IRR UROMATIC (IV SOLUTION) ×1 IMPLANT
SOL PREP POV-IOD 4OZ 10% (MISCELLANEOUS) ×1 IMPLANT
SYR 50ML LL SCALE MARK (SYRINGE) ×4 IMPLANT
TOWEL GREEN STERILE FF (TOWEL DISPOSABLE) ×6 IMPLANT
UNDERPAD 30X36 HEAVY ABSORB (UNDERPADS AND DIAPERS) ×4 IMPLANT
WATER STERILE IRR 1000ML POUR (IV SOLUTION) ×4 IMPLANT

## 2023-12-19 NOTE — Discharge Instr - Supplementary Instructions (Signed)
 May take Tylenol  after 6:40pm if needed for discomfort.

## 2023-12-19 NOTE — H&P (Signed)
 Sandy [name] is an 48 y.o. female. G0 SF presents for surgical management of uterine fibroids using Radiofrequency transcervical ablation of uterine fibroid using sonata. Pt desires to preserve fertility  Pertinent Gynecological History: Menses: flow is excessive with use of 5 pads or tampons on heaviest days Bleeding: dysfunctional uterine bleeding Contraception: none DES exposure: denies Blood transfusions: none Sexually transmitted diseases: no past history Previous GYN Procedures: DNC  Last mammogram: normal Date: 2023 Last pap: normal Date: 2024 OB History: G0, P0   Menstrual History: Menarche age: n/a Patient's last menstrual period was 11/28/2023 (exact date).    Past Medical History:  Diagnosis Date   Alcohol abuse, in remission    taking naltrexone   B12 deficiency    GAD (generalized anxiety disorder)    History of esophageal surgery 05/12/2015   s/p   robotic esphagectomy with removal mass  (benign leiomyoma)   Hyperlipidemia    Leiomyoma of body of uterus    intramural & subserous   MDD (major depressive disorder)    Neuromuscular disorder (HCC)    Pre-diabetes    Vitamin D  deficiency     Past Surgical History:  Procedure Laterality Date   BREAST MASS EXCISION Right 1994   right benign tumor   BREAST REDUCTION SURGERY Bilateral 12/02/2019   Procedure: BILATERAL MAMMARY REDUCTION  (BREAST);  Surgeon: Contogiannis, Kevon Pellegrini, MD;  Location: [facility];  Service: Plastics;  Laterality: Bilateral;   BREATH TEK H PYLORI N/A 03/15/2014   Procedure: BREATH TEK H PYLORI;  Surgeon: Azucena Bollard, MD;  Location: [facility];  Service: Endoscopy;  Laterality: N/A;   CYST REMOVAL NECK  1987   age 43  removal benign left side neck cyst   DILATATION & CURRETTAGE/HYSTEROSCOPY WITH RESECTOCOPE N/A 03/03/2014   Procedure: DILATATION & CURETTAGE/HYSTEROSCOPY WITH RESECTOSCOPE;  Surgeon: Kandra Orn, MD;  Location: [facility];  Service: Gynecology;   Laterality: N/A;   ESOPHAGECTOMY, ROBOT-ASSISTED  05/12/2015   @[facility] by dr l. wudel;   removal esophageal mass   ESOPHAGOGASTRODUODENOSCOPY N/A 03/29/2014   Procedure: ESOPHAGOGASTRODUODENOSCOPY (EGD)/UPPER ENDOSCOPY;  Surgeon: Thayne Fine, MD;  Location: [facility];  Service: General;  Laterality: N/A;   LAPAROSCOPIC CHOLECYSTECTOMY  2006   ORIF ANKLE FRACTURE Right 12/17/2013   Procedure: OPEN REDUCTION INTERNAL FIXATION (ORIF) RIGHT  ANKLE FRACTURE WITH SYNDESMOSIS SENSATION;  Surgeon: Loel Ring, MD;  Location: [facility];  Service: Orthopedics;  Laterality: Right;    Family History  Problem Relation Age of Onset   Healthy Mother    Healthy Father     Social History:  reports that she has quit smoking. Her smoking use included cigarettes. She has never used smokeless tobacco. She reports that she does not currently use alcohol. She reports that she does not use drugs.  Allergies:  Allergies  Allergen Reactions   Oxycodone  Itching    Pt states that she still takes this medication even with itching  (percocet)    No medications prior to admission.    Review of Systems  All other systems reviewed and are negative.   Height 5\' 2"  (1.575 m), weight 99.8 kg, last menstrual period 11/28/2023. Physical Exam Constitutional:      Appearance: Normal appearance. She is obese.  HENT:     Head: Atraumatic.  Cardiovascular:     Rate and Rhythm: Normal rate.     Pulses: Normal pulses.  Pulmonary:     Effort: Pulmonary effort is normal.  Abdominal:  Palpations: Abdomen is soft.  Genitourinary:    General: Normal vulva.     Comments: Vagina nl  Cervix nulliparous Uterus 12 wk irreg Adnexa nl Musculoskeletal:     Cervical back: Normal range of motion.  Skin:    General: Skin is warm and dry.  Neurological:     General: No focal deficit present.     Mental Status: She is alert.     No results found for this or any previous visit (from the past 24  hours).  No results found.  Assessment/Plan: IM/SM/SS fibroids Menorrhagia P) Radiofrequency transcervical ablation of uterine fibroid using sonata. Procedure explained. Risk of surgery reviewed including infection, bleeding, injury to surrounding organ structures, thermal injury, inability to treat all fibroids. Uterine perforation and its risk. All ? answered  Eddis Pingleton A Eboney Claybrook 12/19/2023, 6:09 AM

## 2023-12-19 NOTE — Anesthesia Preprocedure Evaluation (Addendum)
 Anesthesia Evaluation  Patient identified by MRN, date of birth, ID band Patient awake    Reviewed: Allergy & Precautions, NPO status , Patient's Chart, lab work & pertinent test results  Airway Mallampati: II  TM Distance: >3 FB Neck ROM: Full    Dental no notable dental hx.    Pulmonary former smoker   Pulmonary exam normal        Cardiovascular negative cardio ROS  Rhythm:Regular Rate:Normal     Neuro/Psych   Anxiety Depression    negative neurological ROS     GI/Hepatic negative GI ROS, Neg liver ROS,,,  Endo/Other  negative endocrine ROS    Renal/GU negative Renal ROS  Female GU complaint     Musculoskeletal negative musculoskeletal ROS (+)    Abdominal Normal abdominal exam  (+)   Peds  Hematology Lab Results      Component                Value               Date                      WBC                      8.6                 12/19/2023                HGB                      12.6                12/19/2023                HCT                      42.2                12/19/2023                MCV                      79.2 (L)            12/19/2023                PLT                      323                 12/19/2023               Anesthesia Other Findings   Reproductive/Obstetrics                             Anesthesia Physical Anesthesia Plan  ASA: 2  Anesthesia Plan: General   Post-op Pain Management:    Induction: Intravenous  PONV Risk Score and Plan: 3 and Ondansetron , Dexamethasone , Midazolam  and Treatment may vary due to age or medical condition  Airway Management Planned: Mask and LMA  Additional Equipment: None  Intra-op Plan:   Post-operative Plan: Extubation in OR  Informed Consent: I have reviewed the patients History and Physical, chart, labs and discussed the procedure including the risks, benefits and alternatives for the proposed anesthesia with  the patient or  authorized representative who has indicated his/her understanding and acceptance.     Dental advisory given  Plan Discussed with: CRNA  Anesthesia Plan Comments:        Anesthesia Quick Evaluation

## 2023-12-19 NOTE — Discharge Instructions (Addendum)
CALL  IF TEMP>100.4, NOTHING PER VAGINA X 2 WK, CALL IF SOAKING A MAXI  PAD EVERY HOUR OR MORE FREQUENTLY Post Anesthesia Home Care Instructions  Activity: Get plenty of rest for the remainder of the day. A responsible adult should stay with you for 24 hours following the procedure.  For the next 24 hours, DO NOT: -Drive a car -Operate machinery -Drink alcoholic beverages -Take any medication unless instructed by your physician -Make any legal decisions or sign important papers.  Meals: Start with liquid foods such as gelatin or soup. Progress to regular foods as tolerated. Avoid greasy, spicy, heavy foods. If nausea and/or vomiting occur, drink only clear liquids until the nausea and/or vomiting subsides. Call your physician if vomiting continues.  Special Instructions/Symptoms: Your throat may feel dry or sore from the anesthesia or the breathing tube placed in your throat during surgery. If this causes discomfort, gargle with warm salt water. The discomfort should disappear within 24 hours.    

## 2023-12-19 NOTE — Anesthesia Procedure Notes (Signed)
 Procedure Name: LMA Insertion Date/Time: 12/19/2023 12:25 PM  Performed by: Aydeen Blume C, CRNAPre-anesthesia Checklist: Patient identified, Emergency Drugs available, Suction available and Patient being monitored Patient Re-evaluated:Patient Re-evaluated prior to induction Oxygen Delivery Method: Circle System Utilized Preoxygenation: Pre-oxygenation with 100% oxygen Induction Type: IV induction Ventilation: Mask ventilation without difficulty LMA: LMA inserted LMA Size: 4.0 Number of attempts: 1 Airway Equipment and Method: Bite block Placement Confirmation: positive ETCO2 Tube secured with: Tape Dental Injury: Teeth and Oropharynx as per pre-operative assessment

## 2023-12-19 NOTE — Interval H&P Note (Signed)
 History and Physical Interval Note:  12/19/2023 12:02 PM  Sandy [name]  has presented today for surgery, with the diagnosis of Uterine Fibroids.  The various methods of treatment have been discussed with the patient and family. After consideration of risks, benefits and other options for treatment, the patient has consented to  Procedure(s): RADIOFREQUENCY ABLATION, LEIOMYOMA, UTERUS, TRANSCERVICAL APPROACH, WITH US  GUIDANCE (N/A) MYOSURE RESECTION (N/A) DILATATION AND CURETTAGE /HYSTEROSCOPY as a surgical intervention.  The patient's history has been reviewed, patient examined, no change in status, stable for surgery.  I have reviewed the patient's chart and labs.  Questions were answered to the patient's satisfaction.     Gerre [name] Jaydyn Bozzo

## 2023-12-19 NOTE — Anesthesia Postprocedure Evaluation (Signed)
 Anesthesia Post Note  Patient: Sandy [name]  Procedure(s) Performed: RADIOFREQUENCY ABLATION, LEIOMYOMA, UTERUS, TRANSCERVICAL APPROACH, WITH US  GUIDANCE (Vagina ) DILATATION /HYSTEROSCOPY (Vagina )     Patient location during evaluation: PACU Anesthesia Type: General Level of consciousness: awake and alert Pain management: pain level controlled Vital Signs Assessment: post-procedure vital signs reviewed and stable Respiratory status: spontaneous breathing, nonlabored ventilation and respiratory function stable Cardiovascular status: blood pressure returned to baseline and stable Postop Assessment: no apparent nausea or vomiting Anesthetic complications: no   No notable events documented.  Last Vitals:  Vitals:   12/19/23 1438 12/19/23 1447  BP:    Pulse: 75   Resp: 11   Temp:  36.8 C  SpO2: 100%     Last Pain:  Vitals:   12/19/23 1435  TempSrc:   PainSc: 6                  Lynn Recendiz,W. EDMOND

## 2023-12-19 NOTE — Transfer of Care (Signed)
 Immediate Anesthesia Transfer of Care Note  Patient: Sandy [name]  Procedure(s) Performed: RADIOFREQUENCY ABLATION, LEIOMYOMA, UTERUS, TRANSCERVICAL APPROACH, WITH US  GUIDANCE (Vagina ) DILATATION AND CURETTAGE /HYSTEROSCOPY (Vagina )  Patient Location: PACU  Anesthesia Type:General  Level of Consciousness: awake, alert , and sedated  Airway & Oxygen Therapy: Patient Spontanous Breathing and Patient connected to face mask oxygen  Post-op Assessment: Report given to RN and Post -op Vital signs reviewed and stable  Post vital signs: Reviewed and stable  Last Vitals:  Vitals Value Taken Time  BP 142/117 12/19/23 1407  Temp 36.8 C 12/19/23 1407  Pulse 81 12/19/23 1410  Resp 15 12/19/23 1410  SpO2 100 % 12/19/23 1410  Vitals shown include unfiled device data.  Last Pain:  Vitals:   12/19/23 1000  TempSrc: Oral  PainSc: 0-No pain      Patients Stated Pain Goal: 4 (12/19/23 1000)  Complications: No notable events documented.

## 2023-12-20 ENCOUNTER — Encounter (HOSPITAL_COMMUNITY): Payer: Self-pay | Admitting: Obstetrics and Gynecology

## 2023-12-20 NOTE — Op Note (Unsigned)
 [name], [name] MEDICAL RECORD NO: [id number] ACCOUNT NO: [id number] DATE OF BIRTH: [birth year] FACILITY: [facility] LOCATION: [facility] PHYSICIAN: Eyla Tallon A. Lesta Rater, MD  Operative Report   DATE OF PROCEDURE: 12/19/2023  PREOPERATIVE DIAGNOSES:  Menorrhagia and intramural, submucosal, subserosal fibroids.  PROCEDURE:  Radiofrequency transcervical ablation of uterine fibroids using Sonata.  POSTOPERATIVE DIAGNOSES:  Subserosal, intramural and submucosal fibroids and menorrhagia.  ANESTHESIA:  General.  SURGEON:  Ivery Marking, MD  ASSISTANT:  None.  DESCRIPTION OF PROCEDURE:  Under adequate general anesthesia, the patient was placed in the dorsal lithotomy position.  She was sterilely prepped and draped in the usual fashion.  The patient had voided prior to entering the room.  Examination under  anesthesia revealed a retroverted irregular uterus.  No adnexal masses could be appreciated.  A bivalve speculum was placed in the vagina.  A single-tooth tenaculum was placed anterior to the cervix.  The cervix was then serially dilated up to #27 [facility]  dilator.  A diagnostic hysteroscope was introduced into the uterine cavity.  Tubal ostia could be seen.  No lesions or fibroids were noted.  The hysteroscope was removed.  Attempt to place the ultrasound imaging, radiofrequency energy through the  internal os was unsuccessful.  Therefore, the cervix was further dilated up to #29 [facility] dilator.  The treatment device was then inserted transcervically into the uterine cavity.  The fibroids were identified with the ultrasound probe.  Planning and  optimization of ablation by sizing and aligning the graphic overlay targeting guide over the live image was done.  Once the size and location of the ablations were set, the trocar tip introducer was advanced into the fibroid.  After ensuring the guide is  within the serosal boundary, the needle electrodes were deployed.  A second visual safety check  was completed.  Delivery of radiofrequency energy was initiated using the foot switch control.  The Sonata handpiece had been inserted.  The Sonata fibroid  ablation was then performed.  Fibroid size of 2.5, type 5-6 at the 12 o'clock position was identified.  Ablation size of 2.1 x 1.5 with a time of 1 minute and 24 seconds was done.  The second fibroid was 3 cm, type 2-5 at the 6-7 o'clock position.  Two  ablations were done:  2.9 x 2.5, time of 2 minutes and 48 seconds; size of 2.4 x ***, time of 2 minutes was done.  This was right lateral and mid location. Fibroid #3, 2 cm, type 5, 6 o'clock, ablation size of 2.6 x 1.9 with a time of 2 minutes and 18  seconds.  Fibroid #4, 2 cm at the 12-2 o'clock position, type 5, ablation size of 2.4 x 1.7 with a time of 1 minute and 54 seconds.  Fibroid 5, 1.5 cm, type 2, at the 2 o'clock position, ablation size of 1.6 x 1.2, 15 seconds.  All cycles were carried  out under direct ultrasound intrauterine guidance and visualization with the ablation *** noted to be within the serosa at all time.  The fibroid treated appeared ablated with ultrasound guidance without gassing noted by ultrasound appearance.   Hysteroscope was performed, which demonstrated complete ablation of the endometrial surface where the fibroids were located.  Reinsertion of the hysteroscope showed no thermal injury of the endometrial cavity.  All instruments were then removed from the  vagina.  SPECIMEN:  None.  COUNTS:  Sponge and instrument counts x2 was correct.  COMPLICATIONS:  None.  CONDITION:  The patient tolerated the procedure  well.  DISPOSITION:  Was transferred to the recovery room in stable condition.  FLUID DEFICIT:  *** mL.   SHW D: 12/20/2023 7:33:19 am T: 12/20/2023 10:00:00 am  JOB: [id number]/ [id number]

## 2024-02-25 ENCOUNTER — Other Ambulatory Visit: Payer: Self-pay | Admitting: Adult Health

## 2024-02-25 DIAGNOSIS — F411 Generalized anxiety disorder: Secondary | ICD-10-CM

## 2024-02-28 ENCOUNTER — Telehealth: Admitting: Adult Health

## 2024-02-28 ENCOUNTER — Encounter: Payer: Self-pay | Admitting: Adult Health

## 2024-02-28 DIAGNOSIS — F419 Anxiety disorder, unspecified: Secondary | ICD-10-CM | POA: Diagnosis not present

## 2024-02-28 DIAGNOSIS — F32A Depression, unspecified: Secondary | ICD-10-CM

## 2024-02-28 DIAGNOSIS — F411 Generalized anxiety disorder: Secondary | ICD-10-CM

## 2024-02-28 DIAGNOSIS — F3341 Major depressive disorder, recurrent, in partial remission: Secondary | ICD-10-CM

## 2024-02-28 MED ORDER — ALPRAZOLAM 1 MG PO TABS: 1.0000 mg | ORAL_TABLET | Freq: Three times a day (TID) | ORAL | 0 refills | Status: DC | PRN

## 2024-02-28 NOTE — Progress Notes (Signed)
 Sandy [name] [id number] 09-02-1975 48 y.o.  Virtual Visit via Video Note  I connected with pt @ on 02/28/24 at  2:30 PM EDT by a video enabled telemedicine application and verified that I am speaking with the correct person using two identifiers.   I discussed the limitations of evaluation and management by telemedicine and the availability of in person appointments. The patient expressed understanding and agreed to proceed.  I discussed the assessment and treatment plan with the patient. The patient was provided an opportunity to ask questions and all were answered. The patient agreed with the plan and demonstrated an understanding of the instructions.   The patient was advised to call back or seek an in-person evaluation if the symptoms worsen or if the condition fails to improve as anticipated.  I provided 15 minutes of non-face-to-face time during this encounter.  The patient was located at home.  The provider was located at [facility].   Angeline LOISE Sayers, NP   Subjective:   Patient ID:  Sandy [name] is a 48 y.o. (DOB 1976-01-26) female.  Chief Complaint: No chief complaint on file.   HPI Brayla Pat presents for follow-up of anxiety and depression.   Describes mood today as ok. Pleasant. Tearful at times. Mood symptoms - denies depression and irritability. Reports situational anxiety. Reports varying interest and motivation. Reports some worry, rumination and over thinking. Report ongoing stressors in the work setting. Concerned about aging parents - 52's. Reports mood is stable. Stating I feel like I'm struggling. Continues to take the Xanax  as needed for anxiety.  Taking medications as prescribed.  Energy levels stable. Active, does not have a regular exercise routine.  Enjoys some usual interests and activities. Single. Dating. No children. No pets. Parents local. Extended family local.  Appetite adequate. Weight gain. Sleeping well most nights.  Averages 7 to 8 hours. Focus and concentration stable. Completing tasks. Managing aspects of household. Working from home for [employer] - working 8.5 hours a day. Denies SI or HI.  Denies AH or VH. Denies self harm. Denies substance use.  Previous medication trials: Lexapro  Review of Systems:  Review of Systems  Musculoskeletal:  Negative for gait problem.  Neurological:  Negative for tremors.  Psychiatric/Behavioral:         Please refer to HPI    Medications: I have reviewed the patient's current medications.  Current Outpatient Medications  Medication Sig Dispense Refill   albuterol (VENTOLIN HFA) 108 (90 Base) MCG/ACT inhaler Inhale 2 puffs into the lungs every 6 (six) hours as needed for wheezing.     ALPRAZolam  (XANAX ) 1 MG tablet Take 1 mg by mouth 3 (three) times daily as needed for anxiety.     cyanocobalamin (VITAMIN B12) 1000 MCG/ML injection Inject 1,000 mcg into the muscle every 30 (thirty) days.     ergocalciferol  (VITAMIN D2) 1.25 MG (50000 UT) capsule Take 50,000 Units by mouth once a week. Sunday's     ibuprofen  (ADVIL ) 800 MG tablet Take 1 tablet (800 mg total) by mouth every 6 (six) hours as needed. 30 tablet 3   naltrexone (DEPADE) 50 MG tablet Take 50 mg by mouth at bedtime. REVIA (name brand)     SEMAGLUTIDE-WEIGHT MANAGEMENT [place] Inject 4.5 mLs into the skin once a week. Per pt this is compound with other medication that was unsure of name at time of phone call Injection done on Thursday's     No current facility-administered medications for this visit.    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)   Hyperlipidemia    Leiomyoma of body of uterus    intramural & subserous   MDD (major depressive disorder)    Neuromuscular disorder (HCC)    Unknown per pt   Pre-diabetes    Vitamin D  deficiency     Family History  Problem Relation Age of Onset   Healthy Mother    Healthy Father     Social History   Socioeconomic History   Marital status: Single    Spouse name: Not on file   Number of children: Not on file   Years of education: Not on file   Highest education level: Not on file  Occupational History   Occupation: Financial risk analyst  Tobacco Use   Smoking status: Former    Types: Cigarettes   Smokeless tobacco: Never   Tobacco comments:    12-17-2023  pt stated quit smoking 2007,  started age 73 (1996)  smoked for 11 yrs  Vaping Use   Vaping status: Never Used  Substance and Sexual Activity   Alcohol use: Not Currently    Comment: 12-17-2023  pt stated quit alcohol 02/ 2025   (hx alcohol abuse disorder)   Drug use: Never   Sexual activity: Yes    Birth control/protection: Condom  Other Topics Concern   Not on file  Social History Narrative   Not on file   Social Drivers of Health   Financial Resource Strain: Low Risk  (12/04/2023)   Received [facility]   Overall Financial Resource Strain (CARDIA)    Difficulty of Paying Living Expenses: Not hard at all  Food Insecurity: No Food Insecurity (12/04/2023)   Received from [facility]   Hunger Vital Sign    Within the past 12 months, you worried that your food would run out before you got the money to buy more.: Never true    Within the past 12 months, the food you bought just didn't last and you didn't have money to get more.: Never true  Transportation Needs: No Transportation Needs (12/04/2023)   Received from [facility] - [facility]    In the past 12 months, has lack of transportation kept you from  medical appointments or from getting medications?: No    Lack of Transportation (Non-Medical): No  Physical Activity: Not on file  Stress: Not on file  Social Connections: Not on file  Intimate Partner Violence: Not on file    Past Medical History, Surgical history, Social history, and Family history were reviewed and updated as appropriate.   Please see review of systems for further details on the patient's review from today.   Objective:   Physical Exam:  There were no vitals taken for this visit.  Physical Exam Constitutional:      General: She is not in acute distress. Musculoskeletal:        General: No deformity.  Neurological:     Mental Status: She is alert and oriented to person, place, and time.  Coordination: Coordination normal.  Psychiatric:        Attention and Perception: Attention and perception normal. She does not perceive auditory or visual hallucinations.        Mood and Affect: Mood normal. Mood is not anxious or depressed. Affect is not labile, blunt, angry or inappropriate.        Speech: Speech normal.        Behavior: Behavior normal.        Thought Content: Thought content normal. Thought content is not paranoid or delusional. Thought content does not include homicidal or suicidal ideation. Thought content does not include homicidal or suicidal plan.        Cognition and Memory: Cognition and memory normal.        Judgment: Judgment normal.     Comments: Insight intact     Lab Review:     Component Value Date/Time   NA 138 09/09/2019 0936   K 4.0 09/09/2019 0936   CL 100 09/09/2019 0936   CO2 21 09/09/2019 0936   GLUCOSE 87 09/09/2019 0936   GLUCOSE 95 02/04/2014 1306   BUN 6 09/09/2019 0936   CREATININE 0.72 09/09/2019 0936   CREATININE 0.65 02/04/2014 1306   CALCIUM 10.0 09/09/2019 0936   PROT 7.7 09/09/2019 0936   ALBUMIN  4.5 09/09/2019 0936   AST 31 09/09/2019 0936   ALT 32 09/09/2019 0936   ALKPHOS 60 09/09/2019 0936   BILITOT  0.4 09/09/2019 0936   GFRNONAA 104 09/09/2019 0936   GFRAA 120 09/09/2019 0936       Component Value Date/Time   WBC 8.6 12/19/2023 0958   RBC 5.33 (H) 12/19/2023 0958   HGB 12.6 12/19/2023 0958   HGB 12.7 09/09/2019 0936   HCT 42.2 12/19/2023 0958   HCT 40.3 09/09/2019 0936   PLT 323 12/19/2023 0958   PLT 350 09/09/2019 0936   MCV 79.2 (L) 12/19/2023 0958   MCV 76 (L) 09/09/2019 0936   MCH 23.6 (L) 12/19/2023 0958   MCHC 29.9 (L) 12/19/2023 0958   RDW 14.5 12/19/2023 0958   RDW 14.8 09/09/2019 0936   LYMPHSABS 2.6 09/09/2019 0936   MONOABS 0.4 12/13/2013 0640   EOSABS 0.1 09/09/2019 0936   BASOSABS 0.0 09/09/2019 0936    No results found for: POCLITH, LITHIUM   No results found for: PHENYTOIN, PHENOBARB, VALPROATE, CBMZ   .res Assessment: Plan:    Plan:  PDMP reviewed  Xanax  0.5mg  daily as needed  Therapy - Marval Bunde  RTC 3 months  Patient advised to contact office with any questions, adverse effects, or acute worsening in signs and symptoms.  15 minutes spent dedicated to the care of this patient on the date of this encounter to include pre-visit review of records, ordering of medication, post visit documentation, and face-to-face time with the patient discussing anxiety and depression. Discussed continuing current medication regimen.  Discussed potential benefits, risk, and side effects of benzodiazepines to include potential risk of tolerance and dependence, as well as possible drowsiness. Advised patient not to drive if experiencing drowsiness and to take lowest possible effective dose to minimize risk of dependence and tolerance.  There are no diagnoses linked to this encounter.   Please see After Visit Summary for patient specific instructions.  Future Appointments  Date Time Provider Department Center  02/28/2024  2:30 PM Zoiey Christy Nattalie, NP [facility] None    No orders of the defined types were placed in this encounter.      -------------------------------

## 2024-06-01 ENCOUNTER — Telehealth: Admitting: Adult Health

## 2024-06-02 ENCOUNTER — Encounter: Payer: Self-pay | Admitting: Adult Health

## 2024-06-02 ENCOUNTER — Telehealth (INDEPENDENT_AMBULATORY_CARE_PROVIDER_SITE_OTHER): Admitting: Adult Health

## 2024-06-02 DIAGNOSIS — F419 Anxiety disorder, unspecified: Secondary | ICD-10-CM

## 2024-06-02 DIAGNOSIS — F32A Depression, unspecified: Secondary | ICD-10-CM

## 2024-06-02 DIAGNOSIS — F411 Generalized anxiety disorder: Secondary | ICD-10-CM

## 2024-06-02 DIAGNOSIS — F3341 Major depressive disorder, recurrent, in partial remission: Secondary | ICD-10-CM

## 2024-06-02 MED ORDER — ALPRAZOLAM 1 MG PO TABS: 1.0000 mg | ORAL_TABLET | Freq: Three times a day (TID) | ORAL | 0 refills | Status: DC | PRN

## 2024-06-02 MED ORDER — SERTRALINE HCL 50 MG PO TABS: 50.0000 mg | ORAL_TABLET | Freq: Every day | ORAL | 2 refills | Status: DC

## 2024-06-02 NOTE — Progress Notes (Signed)
 Sandy [name] [id number] Jul 20, 1976 48 y.o.  Virtual Visit via Video Note  I connected with pt @ on 06/02/24 at 12:00 PM EST by a video enabled telemedicine application and verified that I am speaking with the correct person using two identifiers.   I discussed the limitations of evaluation and management by telemedicine and the availability of in person appointments. The patient expressed understanding and agreed to proceed.  I discussed the assessment and treatment plan with the patient. The patient was provided an opportunity to ask questions and all were answered. The patient agreed with the plan and demonstrated an understanding of the instructions.   The patient was advised to call back or seek an in-person evaluation if the symptoms worsen or if the condition fails to improve as anticipated.  I provided 30 minutes of non-face-to-face time during this encounter.  The patient was located at home.  The provider was located at [facility].   Angeline LOISE Sayers, NP   Subjective:   Patient ID:  Sandy [name] is a 48 y.o. (DOB [birth year]) female.  Chief Complaint: No chief complaint on file.   HPI Sandy [name] presents for follow-up of anxiety and depression.   Describes mood today as not good at all. Reports increased tearfulness. Stating I'm not doing good at all. Mood symptoms - reports increased anxiety, depression and irritability. Reports symptoms have gradually worsened over the past 2 months and she is struggling to push through the day. Reports increased stressors with family and work. Reports lower interest and motivation. Reports she is struggling to leave the house - work - or just function. She has been isolating more - not wanting to leave the house. Stating I am fearful that I am going to snap. Also stating I have bouts of anger and I feel like I'm going to lose it. Reports she is afraid she will get triggered and say or do something she  wouldn't want too. Report ongoing stressors in the work setting. Reports part of her job is to communicate with people, but is afraid she will say something she would not normally say. Reports not wanting to say things she can't take back. Reports increased worry, rumination and over thinking - it's all the time. Reports obsessive thoughts - thinking about the same thing over and over. Reports not wanting to be around people or talk to people.  Concerned about aging parents and their current needs. Reports mood is lower. Stating I feel like I'm really struggling - I'm not functioning. Continues to take the Xanax  as needed for anxiety, but does not feel like it is helping with her mood symptoms.She is willing to consider medication options to help stabilize her mood. Energy levels very low. Active, does not have a regular exercise routine.  Enjoys some usual interests and activities. Single. Dating. No children. No pets. Parents local. Extended family local.  Appetite decreased - not eating the way I should. Reports weight gain - 20 to 25 pounds - not eating healthy. Reports sleeping difficulties. Averages 4 to 5 hours - every day over the past few months. Reports difficulties with focus and concentration stable - I feel like my manager is knit picking me - her temperament has changed about me.  She is struggling to complete tasks - I am fearful. Reports she has not been completing household tasks. Working from home for [employer] - working 9 to 12 hours a day - 3 to 4 days a week. Denies SI, but sometimes I feel  like giving up sometimes.   Denies HI.  Denies AH or VH. Denies self harm. Denies substance use. Denies   Previous medication trials: Lexapro  Review of Systems:  Review of Systems  Musculoskeletal:  Negative for gait problem.  Neurological:  Negative for tremors.  Psychiatric/Behavioral:         Please refer to HPI    Medications: I have reviewed the patient's current  medications.  Current Outpatient Medications  Medication Sig Dispense Refill   albuterol (VENTOLIN HFA) 108 (90 Base) MCG/ACT inhaler Inhale 2 puffs into the lungs every 6 (six) hours as needed for wheezing.     ALPRAZolam  (XANAX ) 1 MG tablet Take 1 tablet (1 mg total) by mouth 3 (three) times daily as needed for anxiety. 90 tablet 0   cyanocobalamin (VITAMIN B12) 1000 MCG/ML injection Inject 1,000 mcg into the muscle every 30 (thirty) days.     ergocalciferol  (VITAMIN D2) 1.25 MG (50000 UT) capsule Take 50,000 Units by mouth once a week. Sunday's     ibuprofen  (ADVIL ) 800 MG tablet Take 1 tablet (800 mg total) by mouth every 6 (six) hours as needed. 30 tablet 3   naltrexone (DEPADE) 50 MG tablet Take 50 mg by mouth at bedtime. REVIA (name brand)     SEMAGLUTIDE-WEIGHT MANAGEMENT [place] Inject 4.5 mLs into the skin once a week. Per pt this is compound with other medication that was unsure of name at time of phone call Injection done on Thursday's     No current facility-administered medications for this visit.    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)   Hyperlipidemia    Leiomyoma of body of uterus    intramural & subserous   MDD (major depressive disorder)    Neuromuscular disorder (HCC)    Unknown per pt   Pre-diabetes    Vitamin D  deficiency     Family History  Problem Relation Age of Onset   Healthy Mother    Healthy Father     Social History   Socioeconomic History   Marital status: Single    Spouse name: Not on file   Number of children: Not on file   Years of education: Not  on file   Highest education level: Not on file  Occupational History   Occupation: Financial Risk Analyst  Tobacco Use   Smoking status: Former    Types: Cigarettes   Smokeless tobacco: Never   Tobacco comments:    12-17-2023  pt stated quit smoking 2007,  started age 68 (1996)  smoked for 11 yrs  Vaping Use   Vaping status: Never Used  Substance and Sexual Activity   Alcohol use: Not Currently    Comment: 12-17-2023  pt stated quit alcohol 02/ 2025   (hx alcohol abuse disorder)   Drug use: Never   Sexual activity: Yes    Birth control/protection: Condom  Other Topics Concern   Not on file  Social History Narrative   Not on file   Social Drivers of Health   Financial Resource Strain: Low Risk  (  04/06/2024)   Received from [facility]   Overall Financial Resource Strain (CARDIA)    Difficulty of Paying Living Expenses: Not hard at all  Food Insecurity: No Food Insecurity (04/06/2024)   Received from [facility]   Hunger Vital Sign    Within the past 12 months, you worried that your food would run out before you got the money to buy more.: Never true    Within the past 12 months, the food you bought just didn't last and you didn't have money to get more.: Never true  Transportation Needs: No Transportation Needs (04/06/2024)   Received from [facility]    In the past 12 months, has lack of transportation kept you from medical appointments or from getting medications?: No    Lack of Transportation (Non-Medical): No  Physical Activity: Not on file  Stress: Not on file  Social Connections: Not on file  Intimate Partner Violence: Not on file    Past Medical History, Surgical history, Social history, and Family history were reviewed and updated as appropriate.   Please see review of systems for further details on the patient's review from today.   Objective:   Physical Exam:  There were no vitals  taken for this visit.  Physical Exam Constitutional:      General: She is not in acute distress. Musculoskeletal:        General: No deformity.  Neurological:     Mental Status: She is alert and oriented to person, place, and time.     Coordination: Coordination normal.  Psychiatric:        Attention and Perception: Attention and perception normal. She does not perceive auditory or visual hallucinations.        Mood and Affect: Mood is anxious and depressed. Affect is flat, angry and tearful. Affect is not labile, blunt or inappropriate.        Speech: Speech normal.        Behavior: Behavior is agitated and withdrawn.        Thought Content: Thought content is not paranoid or delusional. Thought content does not include homicidal or suicidal ideation. Thought content does not include homicidal or suicidal plan.        Cognition and Memory: Cognition and memory normal.     Comments: Insight intact     Lab Review:     Component Value Date/Time   NA 138 09/09/2019 0936   K 4.0 09/09/2019 0936   CL 100 09/09/2019 0936   CO2 21 09/09/2019 0936   GLUCOSE 87 09/09/2019 0936   GLUCOSE 95 02/04/2014 1306   BUN 6 09/09/2019 0936   CREATININE 0.72 09/09/2019 0936   CREATININE 0.65 02/04/2014 1306   CALCIUM 10.0 09/09/2019 0936   PROT 7.7 09/09/2019 0936   ALBUMIN  4.5 09/09/2019 0936   AST 31 09/09/2019 0936   ALT 32 09/09/2019 0936   ALKPHOS 60 09/09/2019 0936   BILITOT 0.4 09/09/2019 0936   GFRNONAA 104 09/09/2019 0936   GFRAA 120 09/09/2019 0936       Component Value Date/Time   WBC 8.6 12/19/2023 0958   RBC 5.33 (H) 12/19/2023 0958   HGB 12.6 12/19/2023 0958   HGB 12.7 09/09/2019 0936   HCT 42.2 12/19/2023 0958   HCT 40.3 09/09/2019 0936   PLT 323 12/19/2023 0958   PLT 350 09/09/2019 0936   MCV 79.2 (L) 12/19/2023 0958   MCV 76 (L) 09/09/2019 9063  MCH 23.6 (L) 12/19/2023 0958   MCHC 29.9 (L) 12/19/2023 0958   RDW 14.5 12/19/2023 0958   RDW 14.8 09/09/2019 0936    LYMPHSABS 2.6 09/09/2019 0936   MONOABS 0.4 12/13/2013 0640   EOSABS 0.1 09/09/2019 0936   BASOSABS 0.0 09/09/2019 0936    No results found for: POCLITH, LITHIUM   No results found for: PHENYTOIN, PHENOBARB, VALPROATE, CBMZ   .res Assessment: Plan:    Plan:  PDMP reviewed  Add Zoloft 50mg  daily.  Xanax  0.5mg  daily as needed  Therapy - Marval Bunde  RTC 2 weeks  Patient advised to contact office with any questions, adverse effects, or acute worsening in signs and symptoms.  30 minutes spent dedicated to the care of this patient on the date of this encounter to include pre-visit review of records, ordering of medication, post visit documentation, and face-to-face time with the patient discussing anxiety and depression. Discussed adding Zoloft 50mg  daily to help manage mood symptoms. Will plan to take patient out of work effective 06/02/2024 through 09/03/2023.  Discussed potential benefits, risk, and side effects of benzodiazepines to include potential risk of tolerance and dependence, as well as possible drowsiness. Advised patient not to drive if experiencing drowsiness and to take lowest possible effective dose to minimize risk of dependence and tolerance.  There are no diagnoses linked to this encounter.   Please see After Visit Summary for patient specific instructions.  Future Appointments  Date Time Provider Department Center  06/02/2024 12:00 PM Penni Penado, Angeline Mattocks, NP CP-[facility] None  07/02/2024  9:00 AM Burnetta Almarie DASEN, [facility] None    No orders of the defined types were placed in this encounter.     -------------------------------

## 2024-06-03 ENCOUNTER — Telehealth: Payer: Self-pay | Admitting: Adult Health

## 2024-06-03 NOTE — Telephone Encounter (Signed)
 I called Sandy [name] to schedule her follow up. She would like to revisit anger management. She wants a referral to someone who specializes in that. Please call her at [phone]

## 2024-06-04 NOTE — Telephone Encounter (Signed)
 Patient is asking for any recommendations for a therapist for anger management. She said she has contacted someone, but they need to verify insurance, but she would still like some names. She asked that her med list be updated to delete medications she was no longer taking from other providers. I did update meds. She also said her last official day of work was 11/4.

## 2024-06-08 ENCOUNTER — Telehealth: Payer: Self-pay | Admitting: Adult Health

## 2024-06-08 NOTE — Telephone Encounter (Signed)
 Received fax from [facility] paperwork to complete for STD. Charge $45. Will notify pt to pay before forms completed.

## 2024-06-09 NOTE — Telephone Encounter (Signed)
 Pt said she was contacted about payment for disability paperwork. I don't see any documentation about this. She also said the disability company is asking for more information and she is asking if she has to pay every time information is submitted. She has an appt with Tillman tomorrow.

## 2024-06-10 ENCOUNTER — Telehealth: Admitting: Adult Health

## 2024-06-10 ENCOUNTER — Encounter: Payer: Self-pay | Admitting: Adult Health

## 2024-06-10 ENCOUNTER — Other Ambulatory Visit: Payer: Self-pay | Admitting: Adult Health

## 2024-06-10 ENCOUNTER — Telehealth: Payer: Self-pay | Admitting: Adult Health

## 2024-06-10 DIAGNOSIS — F419 Anxiety disorder, unspecified: Secondary | ICD-10-CM | POA: Diagnosis not present

## 2024-06-10 DIAGNOSIS — F1010 Alcohol abuse, uncomplicated: Secondary | ICD-10-CM

## 2024-06-10 DIAGNOSIS — F411 Generalized anxiety disorder: Secondary | ICD-10-CM

## 2024-06-10 DIAGNOSIS — F32A Depression, unspecified: Secondary | ICD-10-CM | POA: Diagnosis not present

## 2024-06-10 DIAGNOSIS — F3341 Major depressive disorder, recurrent, in partial remission: Secondary | ICD-10-CM

## 2024-06-10 DIAGNOSIS — Z0289 Encounter for other administrative examinations: Secondary | ICD-10-CM

## 2024-06-10 MED ORDER — NALTREXONE HCL 50 MG PO TABS: 50.0000 mg | ORAL_TABLET | Freq: Every day | ORAL | 2 refills | Status: DC

## 2024-06-10 NOTE — Telephone Encounter (Signed)
 Pt called at 2:57 stating it was urgent to get a call back from Gina about Naltraxone.  She doesn't need it called in.  She is getting it from another doc

## 2024-06-10 NOTE — Progress Notes (Signed)
 Sandy [name] [id number] 01/02/1976 48 y.o.  Virtual Visit via Video Note  I connected with pt @ on 06/10/24 at 10:30 AM EST by a video enabled telemedicine application and verified that I am speaking with the correct person using two identifiers.   I discussed the limitations of evaluation and management by telemedicine and the availability of in person appointments. The patient expressed understanding and agreed to proceed.  I discussed the assessment and treatment plan with the patient. The patient was provided an opportunity to ask questions and all were answered. The patient agreed with the plan and demonstrated an understanding of the instructions.   The patient was advised to call back or seek an in-person evaluation if the symptoms worsen or if the condition fails to improve as anticipated.  I provided 40 minutes of non-face-to-face time during this encounter.  The patient was located at home.  The provider was located at [facility].   Sandy LOISE Sayers, NP   Subjective:   Patient ID:  Sandy [name] is a 48 y.o. (DOB [birth year]) female.  Chief Complaint: No chief complaint on file.   HPI Sandy [name] presents for follow-up of anxiety, depression and alcohol abuse.   Describes mood today as about the same. Reports tearfulness. Stating I'm still not doing good - I've been in some dark places. Reports mood symptoms - reports ongoing anxiety, depression and irritability - no improvements in mood as of yet. Reports ongoing situational stressors with family and work. Reports lower interest and motivation - I'm having to push myself to do anything. Reports she is not completing self care regularly. Reports she is struggling to function. Reports isolating and not wanting to get out or be around others. Reports feeling angry and gets agitated easily. Reports worry, rumination and over thinking. Reports obsessive thoughts, denies acts - I'm getting stuck on  things and can't move passed it. Reports she is drinking more alcohol than she should - every day for the past few months. 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 She is willing to talk to or seek help for alcohol cessation. Reports mood remains lower. Stating I don't feel like I'm doing any better, maybe worse. Continues to take the Xanax  as needed for anxiety. She has started the Zoloft for mood symptoms. She is willing to consider other treatment and medication options. Energy levels remain low. Active, does not have a regular exercise routine.  Reports she is unable to enjoy usual interests and activities. Single. No children. No pets. Parents local. Extended family local.  Appetite decreased - I don't have an appetite. Reports weight gain - 20 to 25 pounds. Reports sleeping difficulties. Averages 3 to 5 hours a night. Reports 1 to 2 naps for 15 to 20 minutes.   Reports difficulties with focus and concentration - constantly worried - being pulled in different directions. She is completing minimal household tasks. She is also concerned about the impact mood symptoms are having on her job performance. Reports she does not feel like she has been able to communicate effectively with colleagues and leadership. Typically works from home for [employer] - working 9 to 12 hours a day - 3 to 4 days a week, but is on leave currently due to mental health crisis.  Denies SI. Denies HI.  Denies AH or VH. Denies self harm. Denies substance use. Reports alcohol use.   Review of Systems:  Review of Systems  Musculoskeletal:  Negative for gait problem.  Neurological:  Negative for tremors.  Psychiatric/Behavioral:         Please refer to HPI    Medications: I have reviewed the patient's current medications.  Current Outpatient Medications  Medication Sig Dispense Refill   albuterol (VENTOLIN HFA) 108 (90 Base) MCG/ACT inhaler Inhale 2 puffs into the lungs every 6 (six) hours as needed for wheezing.      ALPRAZolam  (XANAX ) 1 MG tablet Take 1 tablet (1 mg total) by mouth 3 (three) times daily as needed for anxiety. 90 tablet 0   cyanocobalamin (VITAMIN B12) 1000 MCG/ML injection Inject 1,000 mcg into the muscle every 30 (thirty) days.     ibuprofen  (ADVIL ) 800 MG tablet Take 1 tablet (800 mg total) by mouth every 6 (six) hours as needed. 30 tablet 3   sertraline (ZOLOFT) 50 MG tablet Take 1 tablet (50 mg total) by mouth daily. 30 tablet 2   No current facility-administered medications for this visit.    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)   Hyperlipidemia    Leiomyoma of body of uterus    intramural & subserous   MDD (major depressive disorder)    Neuromuscular disorder (HCC)    Unknown per pt   Pre-diabetes    Vitamin D  deficiency     Family History  Problem Relation Age of Onset   Healthy Mother    Healthy Father     Social History   Socioeconomic History   Marital status: Single    Spouse name: Not on file   Number of children: Not on file   Years of education: Not on file   Highest education level: Not on file  Occupational History   Occupation: Financial Risk Analyst  Tobacco Use   Smoking status: Former    Types: Cigarettes   Smokeless tobacco: Never   Tobacco comments:    12-17-2023  pt stated quit smoking 2007,  started age 7 (1996)  smoked for 11 yrs  Vaping Use   Vaping status: Never Used  Substance and Sexual Activity   Alcohol use: Not Currently    Comment: 12-17-2023  pt stated quit alcohol 02/ 2025   (hx alcohol abuse disorder)   Drug use: Never    Sexual activity: Yes    Birth control/protection: Condom  Other Topics Concern   Not on file  Social History Narrative   Not on file   Social Drivers of Health   Financial Resource Strain: Low Risk  (04/06/2024)   Received from [facility]   Overall Financial Resource Strain (CARDIA)    Difficulty of Paying Living Expenses: Not hard at all  Food Insecurity: No Food Insecurity (04/06/2024)   Received from [facility]   Hunger Vital Sign    Within the past 12 months, you worried that your food would run out before you got the money to buy more.: Never true    Within the past 12 months, the food you bought just didn't last and you didn't have money to get more.: Never true  Transportation Needs: No Transportation Needs (04/06/2024)  Received from [facility] - Transportation    In the past 12 months, has lack of transportation kept you from medical appointments or from getting medications?: No    Lack of Transportation (Non-Medical): No  Physical Activity: Not on file  Stress: Not on file  Social Connections: Not on file  Intimate Partner Violence: Not on file    Past Medical History, Surgical history, Social history, and Family history were reviewed and updated as appropriate.   Please see review of systems for further details on the patient's review from today.   Objective:   Physical Exam:  There were no vitals taken for this visit.  Physical Exam Constitutional:      General: She is not in acute distress. Musculoskeletal:        General: No deformity.  Neurological:     Mental Status: She is alert and oriented to person, place, and time.     Coordination: Coordination normal.  Psychiatric:        Attention and Perception: Attention and perception normal. She does not perceive auditory or visual hallucinations.        Mood and Affect: Mood is anxious and depressed. Affect is flat, angry and tearful. Affect is  not labile, blunt or inappropriate.        Speech: Speech normal.        Behavior: Behavior is agitated and withdrawn.        Thought Content: Thought content is not paranoid or delusional. Thought content does not include homicidal or suicidal ideation. Thought content does not include homicidal or suicidal plan.        Cognition and Memory: Cognition and memory normal.     Comments: Insight intact     Lab Review:     Component Value Date/Time   NA 138 09/09/2019 0936   K 4.0 09/09/2019 0936   CL 100 09/09/2019 0936   CO2 21 09/09/2019 0936   GLUCOSE 87 09/09/2019 0936   GLUCOSE 95 02/04/2014 1306   BUN 6 09/09/2019 0936   CREATININE 0.72 09/09/2019 0936   CREATININE 0.65 02/04/2014 1306   CALCIUM 10.0 09/09/2019 0936   PROT 7.7 09/09/2019 0936   ALBUMIN  4.5 09/09/2019 0936   AST 31 09/09/2019 0936   ALT 32 09/09/2019 0936   ALKPHOS 60 09/09/2019 0936   BILITOT 0.4 09/09/2019 0936   GFRNONAA 104 09/09/2019 0936   GFRAA 120 09/09/2019 0936       Component Value Date/Time   WBC 8.6 12/19/2023 0958   RBC 5.33 (H) 12/19/2023 0958   HGB 12.6 12/19/2023 0958   HGB 12.7 09/09/2019 0936   HCT 42.2 12/19/2023 0958   HCT 40.3 09/09/2019 0936   PLT 323 12/19/2023 0958   PLT 350 09/09/2019 0936   MCV 79.2 (L) 12/19/2023 0958   MCV 76 (L) 09/09/2019 0936   MCH 23.6 (L) 12/19/2023 0958   MCHC 29.9 (L) 12/19/2023 0958   RDW 14.5 12/19/2023 0958   RDW 14.8 09/09/2019 0936   LYMPHSABS 2.6 09/09/2019 0936   MONOABS 0.4 12/13/2013 0640   EOSABS 0.1 09/09/2019 0936   BASOSABS 0.0 09/09/2019 0936    No results found for: POCLITH, LITHIUM   No results found for: PHENYTOIN, PHENOBARB, VALPROATE, CBMZ   .res Assessment: Plan:    Plan:  PDMP reviewed  Zoloft 50mg  daily Xanax  0.5mg  daily as needed - taking once a week  Restart Naltrexone 50mg  daily for alcohol cessation.  Refer to therapist -  Monique Crutchfield - seen previously  RTC 4 weeks  Patient  advised to contact office with any questions, adverse effects, or acute worsening in signs and symptoms.  40 minutes spent dedicated to the care of this patient on the date of this encounter to include pre-visit review of records, ordering of medication, post visit documentation, and face-to-face time with the patient discussing anxiety, depression and alcohol abuse. Discussed adding Zoloft 50mg  daily to help manage mood symptoms. Will also add Naltrexone 50mg  daily for alcohol cessation. Will plan to restart counseling - working on appointment. Will plan to take patient out of work effective 06/02/2024 through 09/03/2023.  Discussed potential benefits, risk, and side effects of benzodiazepines to include potential risk of tolerance and dependence, as well as possible drowsiness. Advised patient not to drive if experiencing drowsiness and to take lowest possible effective dose to minimize risk of dependence and tolerance.  There are no diagnoses linked to this encounter.   Please see After Visit Summary for patient specific instructions.  Future Appointments  Date Time Provider Department Center  06/10/2024 10:30 AM Lazarius Rivkin, Sandy Mattocks, NP CP-CP None  07/02/2024  9:00 AM Burnetta Almarie DASEN, [facility] None    No orders of the defined types were placed in this encounter.     -------------------------------

## 2024-06-10 NOTE — Telephone Encounter (Signed)
 Spoke with pt and she reports [employer] approved her through 07/02/24, informed her I had not completed or sent any paperwork yet but possibly medical records were sent. Pt thought that was odd they only approved through that date. Pt said she will pay what she needs to for the paperwork but I told her admin staff would be processing that.  Will complete her [employer] paperwork and get sent.

## 2024-06-10 NOTE — Telephone Encounter (Signed)
 Tillman said she would call her.

## 2024-06-11 ENCOUNTER — Telehealth: Payer: Self-pay | Admitting: Adult Health

## 2024-06-11 NOTE — Telephone Encounter (Signed)
 Since Monique Crutchfield is not taking patients, she would like to be referred to someone else there. Please let her know.

## 2024-06-15 ENCOUNTER — Telehealth: Payer: Self-pay | Admitting: Adult Health

## 2024-06-15 NOTE — Telephone Encounter (Signed)
 Pt is wanting a fax to be sent to her with the completed [employer] forms.    Fax [phone]

## 2024-06-19 NOTE — Telephone Encounter (Signed)
 Should be scanned under media and as long as appropriate per admin staff it can be faxed to her.

## 2024-06-22 ENCOUNTER — Encounter: Payer: Self-pay | Admitting: Internal Medicine

## 2024-06-29 ENCOUNTER — Encounter: Payer: Self-pay | Admitting: Adult Health

## 2024-06-29 ENCOUNTER — Telehealth: Admitting: Adult Health

## 2024-06-29 DIAGNOSIS — F32A Depression, unspecified: Secondary | ICD-10-CM | POA: Diagnosis not present

## 2024-06-29 DIAGNOSIS — F419 Anxiety disorder, unspecified: Secondary | ICD-10-CM

## 2024-06-29 DIAGNOSIS — F411 Generalized anxiety disorder: Secondary | ICD-10-CM

## 2024-06-29 DIAGNOSIS — F1010 Alcohol abuse, uncomplicated: Secondary | ICD-10-CM

## 2024-06-29 DIAGNOSIS — F3341 Major depressive disorder, recurrent, in partial remission: Secondary | ICD-10-CM

## 2024-06-29 NOTE — Progress Notes (Signed)
 Sandy [name] [id number] Mar 24, 1976 48 y.o.  Virtual Visit via Video Note  I connected with pt @ on 06/29/24 at 11:00 AM EST by a video enabled telemedicine application and verified that I am speaking with the correct person using two identifiers.   I discussed the limitations of evaluation and management by telemedicine and the availability of in person appointments. The patient expressed understanding and agreed to proceed.  I discussed the assessment and treatment plan with the patient. The patient was provided an opportunity to ask questions and all were answered. The patient agreed with the plan and demonstrated an understanding of the instructions.   The patient was advised to call back or seek an in-person evaluation if the symptoms worsen or if the condition fails to improve as anticipated.  I provided 25 minutes of non-face-to-face time during this encounter. The patient was located at home. The provider was located at [facility].   Sandy LOISE Sayers, NP   Subjective:   Patient ID:  Sandy [name] is a 48 y.o. (DOB 08/03/1975) female.  Chief Complaint: No chief complaint on file.   HPI Sandy [name] presents for follow-up of anxiety, depression and alcohol abuse.   Describes mood today as it's still about the same. Reports tearfulness. Stating I'm not feeling much better. Mood symptoms - reports ongoing anxiety, depression and irritability - no improvement in mood as of yet.  Reports ongoing situational stressors - caregiver for both parents, family and work. Reports lower interest and motivation - having to push herself to do things. Reports she is not completing self care - I have days I don't shower or get dressed. Reports she is still struggling to function. Reports she continues to isolate and avoids being around family and friends. Reports feelings of anger - I still get agitated easily. Reports worry, rumination and over thinking. Reports  obsessive thoughts - running things through my mind. Reports she continues to drink alcohol - going through a 5th of alcohol every 2 days. Reports attributing use to increased stressors - work - family. She is willing to talk to or seek help for alcohol cessation - working with a therapist and also seeking options for alcohol cessation. Reports mood remains lower. Stating I don't feel like I'm doing any better. Continues to take the Xanax  as needed for anxiety. She has started the Zoloft  for mood symptoms. She is willing to consider other treatment and medication options. Plans to commit to taking the Naltrexone  and working on alcohol cessation. Plans to call [facility] about alcohol abuse and see what programs are available. Also plans to continue outpatient therapy. Energy levels remain low. Active, does not have a regular exercise routine.  Reports she is unable to enjoy usual interests and activities. Single. No children. No pets. Parents local. Extended family local.  Appetite decreased. Reports weight gain. Reports sleeping difficulties. Averages 3 to 5 hours a night. Reports 1 to 2 naps for 15 to 20 minutes.   Reports ongoing difficulties with focus and concentration. She reports completing minimal household tasks. Reports she does not feel she is able to return to work. Typically works from home for [employer] - working 9 to 12 hours a day - 3 to 4 days a week, but is on leave currently due to mental health crisis.  Denies SI. Denies HI.  Denies AH or VH. Denies self harm. Denies substance use. Reports alcohol abuse. She is looking for different options to help manage use. Reports she has started working  with a therapist.  Review of Systems:  Review of Systems  Musculoskeletal:  Negative for gait problem.  Neurological:  Negative for tremors.  Psychiatric/Behavioral:         Please refer to HPI    Medications: I have reviewed the patient's current medications.  Current  Outpatient Medications  Medication Sig Dispense Refill   albuterol (VENTOLIN HFA) 108 (90 Base) MCG/ACT inhaler Inhale 2 puffs into the lungs every 6 (six) hours as needed for wheezing.     ALPRAZolam  (XANAX ) 1 MG tablet Take 1 tablet (1 mg total) by mouth 3 (three) times daily as needed for anxiety. 90 tablet 0   cyanocobalamin (VITAMIN B12) 1000 MCG/ML injection Inject 1,000 mcg into the muscle every 30 (thirty) days.     ibuprofen  (ADVIL ) 800 MG tablet Take 1 tablet (800 mg total) by mouth every 6 (six) hours as needed. 30 tablet 3   naltrexone  (DEPADE) 50 MG tablet Take 1 tablet (50 mg total) by mouth daily. 30 tablet 2   sertraline  (ZOLOFT ) 50 MG tablet Take 1 tablet (50 mg total) by mouth daily. 30 tablet 2   No current facility-administered medications for this visit.    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)   Hyperlipidemia    Leiomyoma of body of uterus    intramural & subserous   MDD (major depressive disorder)    Neuromuscular disorder (HCC)    Unknown per pt   Pre-diabetes    Vitamin D  deficiency     Family History  Problem Relation Age of Onset   Healthy Mother    Healthy Father     Social History   Socioeconomic History   Marital status: Single    Spouse name: Not on file   Number of children: Not on file   Years of education: Not on file   Highest education level: Not on file  Occupational History   Occupation: Financial Risk Analyst  Tobacco Use   Smoking status: Former    Types: Cigarettes   Smokeless tobacco: Never   Tobacco comments:    12-17-2023  pt  stated quit smoking 2007,  started age 8 (1996)  smoked for 11 yrs  Vaping Use   Vaping status: Never Used  Substance and Sexual Activity   Alcohol use: Not Currently    Comment: 12-17-2023  pt stated quit alcohol 02/ 2025   (hx alcohol abuse disorder)   Drug use: Never   Sexual activity: Yes    Birth control/protection: Condom  Other Topics Concern   Not on file  Social History Narrative   Not on file   Social Drivers of Health   Financial Resource Strain: Low Risk  (06/11/2024)   Received from [facility]   Overall Financial Resource Strain (CARDIA)    Difficulty of Paying Living Expenses: Not very hard  Food Insecurity: No Food Insecurity (06/11/2024)   Received from [facility]   Hunger Vital Sign    Within the past 12 months, you worried that your food would run out  before you got the money to buy more.: Never true    Within the past 12 months, the food you bought just didn't last and you didn't have money to get more.: Never true  Transportation Needs: No Transportation Needs (06/11/2024)   Received from [facility]    In the past 12 months, has lack of transportation kept you from medical appointments or from getting medications?: No    Lack of Transportation (Non-Medical): No  Physical Activity: Not on file  Stress: Not on file  Social Connections: Not on file  Intimate Partner Violence: Not on file    Past Medical History, Surgical history, Social history, and Family history were reviewed and updated as appropriate.   Please see review of systems for further details on the patient's review from today.   Objective:   Physical Exam:  There were no vitals taken for this visit.  Physical Exam Constitutional:      General: She is not in acute distress. Musculoskeletal:        General: No deformity.  Neurological:     Mental Status: She is alert and oriented to person, place, and  time.     Coordination: Coordination normal.  Psychiatric:        Attention and Perception: Attention and perception normal. She does not perceive auditory or visual hallucinations.        Mood and Affect: Mood is anxious and depressed. Affect is tearful. Affect is not labile, blunt, angry or inappropriate.        Speech: Speech normal.        Behavior: Behavior is withdrawn.        Thought Content: Thought content normal. Thought content is not paranoid or delusional. Thought content does not include homicidal or suicidal ideation. Thought content does not include homicidal or suicidal plan.        Cognition and Memory: Cognition and memory normal.        Judgment: Judgment normal.     Comments: Insight intact     Lab Review:     Component Value Date/Time   NA 138 09/09/2019 0936   K 4.0 09/09/2019 0936   CL 100 09/09/2019 0936   CO2 21 09/09/2019 0936   GLUCOSE 87 09/09/2019 0936   GLUCOSE 95 02/04/2014 1306   BUN 6 09/09/2019 0936   CREATININE 0.72 09/09/2019 0936   CREATININE 0.65 02/04/2014 1306   CALCIUM 10.0 09/09/2019 0936   PROT 7.7 09/09/2019 0936   ALBUMIN  4.5 09/09/2019 0936   AST 31 09/09/2019 0936   ALT 32 09/09/2019 0936   ALKPHOS 60 09/09/2019 0936   BILITOT 0.4 09/09/2019 0936   GFRNONAA 104 09/09/2019 0936   GFRAA 120 09/09/2019 0936       Component Value Date/Time   WBC 8.6 12/19/2023 0958   RBC 5.33 (H) 12/19/2023 0958   HGB 12.6 12/19/2023 0958   HGB 12.7 09/09/2019 0936   HCT 42.2 12/19/2023 0958   HCT 40.3 09/09/2019 0936   PLT 323 12/19/2023 0958   PLT 350 09/09/2019 0936   MCV 79.2 (L) 12/19/2023 0958   MCV 76 (L) 09/09/2019 0936   MCH 23.6 (L) 12/19/2023 0958   MCHC 29.9 (L) 12/19/2023 0958   RDW 14.5 12/19/2023 0958   RDW 14.8 09/09/2019 0936   LYMPHSABS 2.6 09/09/2019 0936   MONOABS 0.4 12/13/2013 0640   EOSABS 0.1 09/09/2019 0936   BASOSABS 0.0 09/09/2019 0936    No results  found for: POCLITH, LITHIUM   No results found  for: PHENYTOIN, PHENOBARB, VALPROATE, CBMZ   .res Assessment: Plan:    Plan:  PDMP reviewed  Zoloft  50mg  daily Xanax  0.5mg  daily as needed - taking once a week  Restart Naltrexone  50mg  daily for alcohol cessation.  Working with individual therapy   Plans to seek help for alcohol abuse - calling [facility] today for outpatient options.  RTC 4 weeks  Patient advised to contact office with any questions, adverse effects, or acute worsening in signs and symptoms.  40 minutes spent dedicated to the care of this patient on the date of this encounter to include pre-visit review of records, ordering of medication, post visit documentation, and face-to-face time with the patient discussing anxiety, depression and alcohol abuse. Discussed adding Zoloft  50mg  daily to help manage mood symptoms. Will also add Naltrexone  50mg  daily for alcohol cessation. Has restarted counseling - working with   Will plan to take patient out of work effective 06/02/2024 through 09/03/2023.   Discussed potential benefits, risk, and side effects of benzodiazepines to include potential risk of tolerance and dependence, as well as possible drowsiness. Advised patient not to drive if experiencing drowsiness and to take lowest possible effective dose to minimize risk of dependence and tolerance.   There are no diagnoses linked to this encounter.   Please see After Visit Summary for patient specific instructions.  Future Appointments  Date Time Provider Department Center  06/29/2024 11:00 AM Antrice Pal, Sandy Mattocks, NP CP-[facility] None  07/02/2024  9:00 AM Burnetta Almarie DASEN, [facility] None    No orders of the defined types were placed in this encounter.     -------------------------------

## 2024-06-30 NOTE — Anesthesia Preprocedure Evaluation (Signed)
 Anesthesia Evaluation  Patient identified by MRN, date of birth, ID band Patient awake    Reviewed: Allergy & Precautions, H&P , NPO status , Patient's Chart, lab work & pertinent test results  Airway Mallampati: III  TM Distance: >3 FB Neck ROM: full    Dental no notable dental hx.    Pulmonary former smoker   Pulmonary exam normal        Cardiovascular negative cardio ROS Normal cardiovascular exam     Neuro/Psych  PSYCHIATRIC DISORDERS Anxiety     negative neurological ROS     GI/Hepatic ,,,(+)     substance abuse  alcohol use10/13/2016: History of esophageal surgery     Comment:  s/p   robotic esphagectomy with removal mass  (benign               leiomyoma)   Endo/Other  negative endocrine ROS    Renal/GU negative Renal ROS  negative genitourinary   Musculoskeletal   Abdominal  (+) + obese  Peds  Hematology negative hematology ROS (+)   Anesthesia Other Findings Past Medical History: No date: Alcohol abuse, in remission     Comment:  taking naltrexone  No date: B12 deficiency No date: GAD (generalized anxiety disorder) 05/12/2015: History of esophageal surgery     Comment:  s/p   robotic esphagectomy with removal mass  (benign               leiomyoma) No date: Hyperlipidemia No date: Leiomyoma of body of uterus     Comment:  intramural & subserous No date: MDD (major depressive disorder) No date: Neuromuscular disorder (HCC)     Comment:  Unknown per pt No date: Pre-diabetes No date: Vitamin D  deficiency  Past Surgical History: 1994: BREAST MASS EXCISION; Right     Comment:  right benign tumor 12/02/2019: BREAST REDUCTION SURGERY; Bilateral     Comment:  Procedure: BILATERAL MAMMARY REDUCTION  (BREAST);                Surgeon: Contogiannis, Ronal Caldron, MD;  Location: [facility]               [place];  Service: Plastics;  Laterality:               Bilateral; 03/15/2014: BREATH TEK H PYLORI;  N/A     Comment:  Procedure: BREATH TEK H PYLORI;  Surgeon: Donnice KATHEE Lunger, MD;  Location: [facility];  Service: Endoscopy;              Laterality: N/A; 1987: CYST REMOVAL NECK     Comment:  age 73  removal benign left side neck cyst 03/03/2014: DILATATION & CURRETTAGE/HYSTEROSCOPY WITH RESECTOCOPE; N/A     Comment:  Procedure: DILATATION & CURETTAGE/HYSTEROSCOPY WITH               RESECTOSCOPE;  Surgeon: Dickie DELENA Carder, MD;                Location: [facility];  Service: Gynecology;  Laterality: N/A; 05/12/2015: ESOPHAGECTOMY, ROBOT-ASSISTED     Comment:  @[email or web address] --WS by dr l. wudel;   removal esophageal mass 03/29/2014: ESOPHAGOGASTRODUODENOSCOPY; N/A     Comment:  Procedure: ESOPHAGOGASTRODUODENOSCOPY (EGD)/UPPER               ENDOSCOPY;  Surgeon: Alm VEAR Angle, MD;  Location: [facility];  Service: General;  Laterality: N/A; 12/19/2023: HYSTEROSCOPY WITH D & C     Comment:  Procedure: DILATATION /HYSTEROSCOPY;  Surgeon: Rutherford Gain, MD;  Location: [facility];  Service: Gynecology;; 2006: LAPAROSCOPIC CHOLECYSTECTOMY 12/17/2013: ORIF ANKLE FRACTURE; Right     Comment:  Procedure: OPEN REDUCTION INTERNAL FIXATION (ORIF) RIGHT              ANKLE FRACTURE WITH SYNDESMOSIS SENSATION;  Surgeon:               Reyes JAYSON Billing, MD;  Location: [facility];  Service:               Orthopedics;  Laterality: Right; 12/19/2023: RADIOFREQUENCY ABLATION, LEIOMYOMA, UTERUS, TRANSCERVICAL  APPROACH, WITH US  GUIDANCE; N/A     Comment:  Procedure: RADIOFREQUENCY ABLATION, LEIOMYOMA, UTERUS,               TRANSCERVICAL APPROACH, WITH US  GUIDANCE;  Surgeon:               Rutherford Gain, MD;  Location: [facility];  Service:               Gynecology;  Laterality: N/A;     Reproductive/Obstetrics negative OB ROS                              Anesthesia Physical Anesthesia Plan  ASA: 3  Anesthesia Plan: General   Post-op Pain  Management: Minimal or no pain anticipated   Induction: Intravenous  PONV Risk Score and Plan: Propofol  infusion and TIVA  Airway Management Planned: Natural Airway  Additional Equipment:   Intra-op Plan:   Post-operative Plan:   Informed Consent: I have reviewed the patients History and Physical, chart, labs and discussed the procedure including the risks, benefits and alternatives for the proposed anesthesia with the patient or authorized representative who has indicated his/her understanding and acceptance.     Dental Advisory Given  Plan Discussed with: CRNA and Surgeon  Anesthesia Plan Comments:          Anesthesia Quick Evaluation

## 2024-07-01 ENCOUNTER — Encounter: Payer: Self-pay | Admitting: Internal Medicine

## 2024-07-01 ENCOUNTER — Encounter: Payer: Self-pay | Admitting: Anesthesiology

## 2024-07-01 ENCOUNTER — Ambulatory Visit
Admission: RE | Admit: 2024-07-01 | Discharge: 2024-07-01 | Disposition: A | Attending: Internal Medicine | Admitting: Internal Medicine

## 2024-07-01 ENCOUNTER — Other Ambulatory Visit: Payer: Self-pay

## 2024-07-01 ENCOUNTER — Encounter: Admission: RE | Disposition: A | Payer: Self-pay | Source: Home / Self Care | Attending: Internal Medicine

## 2024-07-01 ENCOUNTER — Ambulatory Visit: Payer: Self-pay | Admitting: Anesthesiology

## 2024-07-01 HISTORY — PX: COLONOSCOPY: SHX5424

## 2024-07-01 LAB — POCT PREGNANCY, URINE: Preg Test, Ur: NEGATIVE

## 2024-07-01 SURGERY — COLONOSCOPY
Anesthesia: General

## 2024-07-01 MED ORDER — SODIUM CHLORIDE 0.9 % IV SOLN: INTRAVENOUS | Status: DC

## 2024-07-01 MED ORDER — PROPOFOL 500 MG/50ML IV EMUL
INTRAVENOUS | Status: DC | PRN
  Administered 2024-07-01: 150 ug/kg/min via INTRAVENOUS

## 2024-07-01 MED ORDER — PROPOFOL 10 MG/ML IV BOLUS
INTRAVENOUS | Status: DC | PRN
  Administered 2024-07-01: 70 mg via INTRAVENOUS
  Administered 2024-07-01: 100 mg via INTRAVENOUS
  Administered 2024-07-01: 20 mg via INTRAVENOUS

## 2024-07-01 MED ORDER — LIDOCAINE HCL (CARDIAC) PF 100 MG/5ML IV SOSY
PREFILLED_SYRINGE | INTRAVENOUS | Status: DC | PRN
  Administered 2024-07-01: 100 mg via INTRAVENOUS

## 2024-07-01 NOTE — Anesthesia Postprocedure Evaluation (Signed)
 Anesthesia Post Note  Patient: Sandy [name]  Procedure(s) Performed: COLONOSCOPY  Patient location during evaluation: Endoscopy Anesthesia Type: General Level of consciousness: awake and alert Pain management: pain level controlled Vital Signs Assessment: post-procedure vital signs reviewed and stable Respiratory status: spontaneous breathing, nonlabored ventilation and respiratory function stable Cardiovascular status: blood pressure returned to baseline and stable Postop Assessment: no apparent nausea or vomiting Anesthetic complications: no   No notable events documented.   Last Vitals:  Vitals:   07/01/24 1105 07/01/24 1114  BP: 124/87 118/79  Pulse: 82 84  Resp: 20 16  SpO2: 100% 98%    Last Pain:  Vitals:   07/01/24 1055  TempSrc:   PainSc: 0-No pain                 Camellia Merilee Louder

## 2024-07-01 NOTE — H&P (Signed)
 Outpatient short stay form Pre-procedure 07/01/2024 9:49 AM Sandy [name], M.D.  Primary Physician: Sandy [name], M.D.  Reason for visit:  Colon cancer screening     History of present illness:  Sandy [name] presents to the [facility] GI clinic at the request of her PCP for chief complaint of routine-risk colon cancer screening. She is colonoscopy naive and denies any known family history of colorectal cancer, advanced adenomas, or IBD. She is wanting to get a colonoscopy scheduled. She reports no major changes in her bowel habits. She typically has 3-4 bowel movements daily. She denies any issues with hematochezia, melena, fecal urgency, or fecal incontinence. She gets intermittent generalized abdominal cramping randomly during the day several times a month. No clear triggers. Cramping can last couple minutes and then subside. Cramping doesn't always occur before or after a BM. She thinks sometimes after eating certain foods she can get the cramping. She thinks it might be related to red meat or dairy products. She gets occasional reflux symptoms in the evenings after dinner depending on what she ate. She denies any nausea, vomiting, esophageal dysphagia, odynophagia, early satiety, hoarseness, or epigastric abdominal pain. She was on compounded Wegovy earlier this year, but stopped due to cost of medication. She is thinking about paying out of pocket for it. She has hx of EtOH abuse. She was sober for 57-months and is taking Naltrexone  managed by outpatient treatment center [name], PA-C). However, she is back to drinking now. It is hard to quantify her drinking. She estimates few drinks per week. She might miss a dose of Naltrexone  once a week if she forgets to take it. She tries her best to remember to take it daily. She denies any known family history of cirrhosis or chronic liver disease. No elevated LFTs. She denies any complaints of jaundice, pruritus, altered  mental status, or new skin rashes. She did have esophageal mass diagnosed in 2016 which was found to be leiomyoma. She is s/p left robotic-assisted resection of leiomyoma.     Current Facility-Administered Medications:    0.9 %  sodium chloride  infusion, , Intravenous, Continuous, Sandy [name] K, MD  Medications Prior to Admission  Medication Sig Dispense Refill Last Dose/Taking   albuterol (VENTOLIN HFA) 108 (90 Base) MCG/ACT inhaler Inhale 2 puffs into the lungs every 6 (six) hours as needed for wheezing.      ALPRAZolam  (XANAX ) 1 MG tablet Take 1 tablet (1 mg total) by mouth 3 (three) times daily as needed for anxiety. 90 tablet 0    cyanocobalamin (VITAMIN B12) 1000 MCG/ML injection Inject 1,000 mcg into the muscle every 30 (thirty) days.      ibuprofen  (ADVIL ) 800 MG tablet Take 1 tablet (800 mg total) by mouth every 6 (six) hours as needed. 30 tablet 3    naltrexone  (DEPADE) 50 MG tablet Take 1 tablet (50 mg total) by mouth daily. 30 tablet 2    sertraline  (ZOLOFT ) 50 MG tablet Take 1 tablet (50 mg total) by mouth daily. 30 tablet 2      Allergies  Allergen Reactions   Oxycodone  Itching    Pt states that she still takes this medication even with itching  (percocet)     Past Medical History:  Diagnosis Date   Alcohol abuse, in remission    taking naltrexone    B12 deficiency    GAD (generalized anxiety disorder)    History of esophageal surgery 05/12/2015   s/p   robotic esphagectomy with removal mass  (  benign leiomyoma)   Hyperlipidemia    Leiomyoma of body of uterus    intramural & subserous   MDD (major depressive disorder)    Neuromuscular disorder (HCC)    Unknown per pt   Pre-diabetes    Vitamin D  deficiency     Review of systems:  Otherwise negative.    Physical Exam  Gen: Alert, oriented. Appears stated age.  HEENT: [place]/AT. PERRLA. Lungs: CTA, no wheezes. CV: RR nl S1, S2. Abd: soft, benign, no masses. BS+ Ext: No edema. Pulses 2+    Planned  procedures: Proceed with colonoscopy. The patient understands the nature of the planned procedure, indications, risks, alternatives and potential complications including but not limited to bleeding, infection, perforation, damage to internal organs and possible oversedation/side effects from anesthesia. The patient agrees and gives consent to proceed.  Please refer to procedure notes for findings, recommendations and patient disposition/instructions.     Conroy Goracke K. [name], M.D. Gastroenterology 07/01/2024  9:49 AM

## 2024-07-01 NOTE — Op Note (Signed)
 [facility] Gastroenterology Patient Name: Sandy [name] Procedure Date: 07/01/2024 10:21 AM MRN: [id number] Account #: [id number] Date of Birth: 1976/04/09 Admit Type: Outpatient Age: 48 Room: [facility] ENDO ROOM 2 Gender: Female Note Status: Finalized Instrument Name: Colon Scope [id number] Procedure:             Colonoscopy Indications:           Screening for colorectal malignant neoplasm Providers:             Mays Paino K. Aundria MD, MD Referring MD:          Lavenia Beaver, MD (Referring MD) Medicines:             Propofol  per Anesthesia Complications:         No immediate complications. Procedure:             Pre-Anesthesia Assessment:                        - The risks and benefits of the procedure and the                         sedation options and risks were discussed with the                         patient. All questions were answered and informed                         consent was obtained.                        - Patient identification and proposed procedure were                         verified prior to the procedure by the nurse. The                         procedure was verified in the procedure room.                        - ASA Grade Assessment: II - A patient with mild                         systemic disease.                        - After reviewing the risks and benefits, the patient                         was deemed in satisfactory condition to undergo the                         procedure.                        After obtaining informed consent, the colonoscope was                         passed under direct vision. Throughout the procedure,  the patient's blood pressure, pulse, and oxygen                         saturations were monitored continuously. The                         Colonoscope was introduced through the anus and                         advanced to the the cecum, identified by appendiceal                          orifice and ileocecal valve. The colonoscopy was                         performed without difficulty. The patient tolerated                         the procedure well. The quality of the bowel                         preparation was good. The ileocecal valve, appendiceal                         orifice, and rectum were photographed. Findings:      The perianal and digital rectal examinations were normal. Pertinent       negatives include normal sphincter tone and no palpable rectal lesions.      The entire examined colon appeared normal on direct and retroflexion       views. Impression:            - The entire examined colon is normal on direct and                         retroflexion views.                        - No specimens collected. Recommendation:        - Patient has a contact number available for                         emergencies. The signs and symptoms of potential                         delayed complications were discussed with the patient.                         Return to normal activities tomorrow. Written                         discharge instructions were provided to the patient.                        - Resume previous diet.                        - Continue present medications.                        - Repeat colonoscopy  in 10 years for screening                         purposes.                        - Return to GI office PRN.                        - The findings and recommendations were discussed with                         the patient. Procedure Code(s):     --- Professional ---                        H9878, Colorectal cancer screening; colonoscopy on                         individual not meeting criteria for high risk Diagnosis Code(s):     --- Professional ---                        Z12.11, Encounter for screening for malignant neoplasm                         of colon CPT copyright 2022 [employer]. All rights  reserved. The codes documented in this report are preliminary and upon coder review may  be revised to meet current compliance requirements. Ladell MARLA Boss MD, MD 07/01/2024 10:52:01 AM This report has been signed electronically. Number of Addenda: 0 Note Initiated On: 07/01/2024 10:21 AM Scope Withdrawal Time: 0 hours 5 minutes 32 seconds  Total Procedure Duration: 0 hours 11 minutes 0 seconds  Estimated Blood Loss:  Estimated blood loss: none.      [facility]

## 2024-07-01 NOTE — Transfer of Care (Signed)
 Immediate Anesthesia Transfer of Care Note  Patient: Sandy [name]  Procedure(s) Performed: COLONOSCOPY  Patient Location: Endoscopy Unit  Anesthesia Type:General  Level of Consciousness: awake, alert , oriented, and patient cooperative  Airway & Oxygen Therapy: Patient Spontanous Breathing and Patient connected to nasal cannula oxygen  Post-op Assessment: Report given to RN and Post -op Vital signs reviewed and stable  Post vital signs: Reviewed and stable  Last Vitals:  Vitals Value Taken Time  BP    Temp    Pulse 96 07/01/24 10:54  Resp 14 07/01/24 10:54  SpO2 96 % 07/01/24 10:54  Vitals shown include unfiled device data.  Last Pain:  Vitals:   07/01/24 1003  TempSrc: Temporal  PainSc: 0-No pain         Complications: No notable events documented.

## 2024-07-01 NOTE — Interval H&P Note (Signed)
 History and Physical Interval Note:  07/01/2024 9:50 AM  Sandy [name]  has presented today for surgery, with the diagnosis of Colon cancer screening (Z12.11).  The various methods of treatment have been discussed with the patient and family. After consideration of risks, benefits and other options for treatment, the patient has consented to  Procedure(s): COLONOSCOPY (N/A) as a surgical intervention.  The patient's history has been reviewed, patient examined, no change in status, stable for surgery.  I have reviewed the patient's chart and labs.  Questions were answered to the patient's satisfaction.     [place], Jamaree Hosier

## 2024-07-02 ENCOUNTER — Ambulatory Visit: Admitting: Professional Counselor

## 2024-07-03 ENCOUNTER — Telehealth: Payer: Self-pay | Admitting: Adult Health

## 2024-07-03 NOTE — Telephone Encounter (Signed)
 Pt called at 10:20 asking for Sandy [name] stating she got a message on 11/29 that we declined to send info for her about short term disability.  She is asking if someone would call her back about the status asap.

## 2024-07-08 NOTE — Telephone Encounter (Signed)
See other phone messages.  

## 2024-07-10 ENCOUNTER — Telehealth: Admitting: Adult Health

## 2024-07-15 ENCOUNTER — Telehealth: Admitting: Adult Health

## 2024-07-15 DIAGNOSIS — F419 Anxiety disorder, unspecified: Secondary | ICD-10-CM | POA: Diagnosis not present

## 2024-07-15 DIAGNOSIS — F411 Generalized anxiety disorder: Secondary | ICD-10-CM

## 2024-07-15 DIAGNOSIS — F1010 Alcohol abuse, uncomplicated: Secondary | ICD-10-CM | POA: Diagnosis not present

## 2024-07-15 DIAGNOSIS — F3341 Major depressive disorder, recurrent, in partial remission: Secondary | ICD-10-CM

## 2024-07-15 DIAGNOSIS — F32A Depression, unspecified: Secondary | ICD-10-CM

## 2024-07-15 NOTE — Progress Notes (Unsigned)
 Sandy [name] [id number] [birth year] 48 y.o.  Virtual Visit via Video Note  I connected with pt @ on 07/16/2024 at  9:30 AM EST by a video enabled telemedicine application and verified that I am speaking with the correct person using two identifiers.   I discussed the limitations of evaluation and management by telemedicine and the availability of in person appointments. The patient expressed understanding and agreed to proceed.  I discussed the assessment and treatment plan with the patient. The patient was provided an opportunity to ask questions and all were answered. The patient agreed with the plan and demonstrated an understanding of the instructions.   The patient was advised to call back or seek an in-person evaluation if the symptoms worsen or if the condition fails to improve as anticipated.  I provided 30 minutes of non-face-to-face time during this encounter.  The patient was located at home.  The provider was located at [facility].   Sandy LOISE Sayers, NP   Subjective:   Patient ID:  Sandy [name] is a 48 y.o. (DOB [birth year]) female.  Chief Complaint: No chief complaint on file.   HPI Sandy [name] presents for follow-up of anxiety, depression and alcohol abuse.   Describes mood today as not too good. Reports tearfulness. Stating I feel about the same - not really any better. Mood symptoms - reports increased anxiety, depression and irritability - situational stressors make things worse. Reports ongoing situational stressors effecting mood. Reports lower interest and motivation - I'm still having to push myself to go. Reports she is making some progress with completing self care - I have no motivation to do it though. Reports she is still struggling to function. Reports she continues to isolate and avoids being around family and friends. Reports increased feelings of anger - I get agitated easily. Reports worry, rumination and over thinking.  Reports obsessive thoughts - things from the past. Reports she continues to drink alcohol, but is weaning off safely - she is under the advisement and instruction of [facility] - working through a withdrawal protocol. Reports mood remains lower. Stating I don't feel like I'm doing any better, but I'm trying. Reports increased stressors with getting a care team together along with difficulties with leave from work. She continues to take the Xanax  as needed for anxiety. She has started the Zoloft  for mood symptoms and feels it is helping some. She is willing to consider other treatment and medication options for mood stabilization. Reports working on getting her care team together. She is also working  with a office manager, a medication provider and therapist at [facility].   Energy levels still lower. Active, does not have a regular exercise routine.  Reports she is unable to enjoy usual interests and activities. Single. No children. No pets. Parents local. Extended family local.  Appetite is about the same - lower. Reports weight loss -  pounds. Reports sleeping difficulties - broken. Averages 3 to 5 hours a night. Reports 1 to 2 naps for 15 to 20 minutes.   Reports continued difficulties with focus and concentration. She reports completing minimal household tasks. Reports she does not feel she is able to return to work. Typically works from home for [employer] - but is on leave currently due to mental health crisis.  Denies SI. Denies HI.  Denies AH or VH. Denies self harm. Denies substance use. Reports alcohol use.   Review of Systems:  Review of Systems  Musculoskeletal:  Negative for gait problem.  Neurological:  Negative for tremors.  Psychiatric/Behavioral:         Please refer to HPI    Medications: I have reviewed the patient's current medications.  Current Outpatient Medications  Medication Sig Dispense Refill   albuterol (VENTOLIN HFA) 108 (90 Base) MCG/ACT  inhaler Inhale 2 puffs into the lungs every 6 (six) hours as needed for wheezing.     ALPRAZolam  (XANAX ) 1 MG tablet Take 1 tablet (1 mg total) by mouth 3 (three) times daily as needed for anxiety. 90 tablet 0   cyanocobalamin (VITAMIN B12) 1000 MCG/ML injection Inject 1,000 mcg into the muscle every 30 (thirty) days.     ibuprofen  (ADVIL ) 800 MG tablet Take 1 tablet (800 mg total) by mouth every 6 (six) hours as needed. 30 tablet 3   naltrexone  (DEPADE) 50 MG tablet Take 1 tablet (50 mg total) by mouth daily. 30 tablet 2   sertraline  (ZOLOFT ) 50 MG tablet Take 1 tablet (50 mg total) by mouth daily. 30 tablet 2   No current facility-administered medications for this visit.    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)   Hyperlipidemia    Leiomyoma of body of uterus    intramural & subserous   MDD (major depressive disorder)    Neuromuscular disorder (HCC)    Unknown per pt   Pre-diabetes    Vitamin D  deficiency     Family History  Problem Relation Age of Onset   Healthy Mother    Healthy Father     Social History   Socioeconomic History   Marital status: Single    Spouse name: Not on file   Number of children: Not on file   Years of education: Not on file   Highest education level: Not on file  Occupational History   Occupation: Financial Risk Analyst  Tobacco Use   Smoking status: Former    Types: Cigarettes   Smokeless tobacco: Never   Tobacco comments:    12-17-2023  pt stated quit smoking 2007,  started age 68 (1996)  smoked for 11 yrs  Vaping Use   Vaping status: Never Used  Substance and Sexual Activity   Alcohol use: Not Currently    Comment: 12-17-2023  pt stated quit alcohol 02/ 2025    (hx alcohol abuse disorder)   Drug use: Never   Sexual activity: Yes    Birth control/protection: Condom  Other Topics Concern   Not on file  Social History Narrative   Not on file   Social Drivers of Health   Tobacco Use: Medium Risk (07/16/2024)   Patient History    Smoking Tobacco Use: Former    Smokeless Tobacco Use: Never    Passive Exposure: Not on Actuary Strain: Low Risk  (07/06/2024)   Received from [facility]   Overall Financial Resource Strain (CARDIA)    Difficulty of Paying Living Expenses: Not hard at all  Food Insecurity: No Food Insecurity (07/06/2024)   Received from [facility]   Epic    Within the past 12 months, you worried that your food would run out before you got the money to buy more.: Never true    Within  the past 12 months, the food you bought just didn't last and you didn't have money to get more.: Never true  Transportation Needs: No Transportation Needs (07/06/2024)   Received from [facility] - Transportation    In the past 12 months, has lack of transportation kept you from medical appointments or from getting medications?: No    Lack of Transportation (Non-Medical): No  Physical Activity: Not on file  Stress: Not on file  Social Connections: Not on file  Intimate Partner Violence: Not on file  Depression (PHQ2-9): Medium Risk (03/22/2023)   Depression (PHQ2-9)    PHQ-2 Score: 5  Alcohol Screen: Not on file  Housing: Low Risk  (07/06/2024)   Received from [facility]   Epic    In the last 12 months, was there a time when you were not able to pay the mortgage or rent on time?: No    In the past 12 months, how many times have you moved where you were living?: 0    At any time in the past 12 months, were you homeless or living in a shelter (including now)?: No  Utilities: Not At Risk (07/06/2024)   Received from [facility]   Epic     In the past 12 months has the electric, gas, oil, or water  company threatened to shut off services in your home?: No  Health Literacy: Not on file    Past Medical History, Surgical history, Social history, and Family history were reviewed and updated as appropriate.   Please see review of systems for further details on the patient's review from today.   Objective:   Physical Exam:  There were no vitals taken for this visit.  Physical Exam Constitutional:      General: She is not in acute distress. Musculoskeletal:        General: No deformity.  Neurological:     Mental Status: She is alert and oriented to person, place, and time.     Coordination: Coordination normal.  Psychiatric:        Attention and Perception: Attention and perception normal. She does not perceive auditory or visual hallucinations.        Mood and Affect: Mood is anxious and depressed. Affect is flat, angry and tearful. Affect is not labile, blunt or inappropriate.        Speech: Speech normal.        Behavior: Behavior is agitated and withdrawn.        Thought Content: Thought content is not paranoid or delusional. Thought content does not include homicidal or suicidal ideation. Thought content does not include homicidal or suicidal plan.        Cognition and Memory: Cognition and memory normal.     Comments: Insight intact     Lab Review:     Component Value Date/Time   NA 138 09/09/2019 0936   K 4.0 09/09/2019 0936   CL 100 09/09/2019 0936   CO2 21 09/09/2019 0936   GLUCOSE 87 09/09/2019 0936   GLUCOSE 95 02/04/2014 1306   BUN 6 09/09/2019 0936   CREATININE 0.72 09/09/2019 0936   CREATININE 0.65 02/04/2014 1306   CALCIUM 10.0 09/09/2019 0936   PROT 7.7 09/09/2019 0936   ALBUMIN  4.5 09/09/2019 0936   AST 31 09/09/2019 0936   ALT 32 09/09/2019 0936   ALKPHOS 60 09/09/2019 0936   BILITOT 0.4 09/09/2019 0936   GFRNONAA 104 09/09/2019 0936  GFRAA 120 09/09/2019 0936       Component Value  Date/Time   WBC 8.6 12/19/2023 0958   RBC 5.33 (H) 12/19/2023 0958   HGB 12.6 12/19/2023 0958   HGB 12.7 09/09/2019 0936   HCT 42.2 12/19/2023 0958   HCT 40.3 09/09/2019 0936   PLT 323 12/19/2023 0958   PLT 350 09/09/2019 0936   MCV 79.2 (L) 12/19/2023 0958   MCV 76 (L) 09/09/2019 0936   MCH 23.6 (L) 12/19/2023 0958   MCHC 29.9 (L) 12/19/2023 0958   RDW 14.5 12/19/2023 0958   RDW 14.8 09/09/2019 0936   LYMPHSABS 2.6 09/09/2019 0936   MONOABS 0.4 12/13/2013 0640   EOSABS 0.1 09/09/2019 0936   BASOSABS 0.0 09/09/2019 0936    No results found for: POCLITH, LITHIUM   No results found for: PHENYTOIN, PHENOBARB, VALPROATE, CBMZ   .res Assessment: Plan:    Plan:  PDMP reviewed  Zoloft  50mg  daily Xanax  0.5mg  daily as needed - taking once a week  Naltrexone  50mg  daily for alcohol cessation through [facility].  Ms Caspers has established a care team to help manage her mood symptoms and recovery. She is working with a office manager, a medication provider and therapist at [facility]. She will maintain monthly medication checks. She remains totally disabled and unable to work. Patient out of work effective 06/02/2024 through 09/03/2023.   RTC 4 weeks  Patient advised to contact office with any questions, adverse effects, or acute worsening in signs and symptoms.  30 minutes spent dedicated to the care of this patient on the date of this encounter to include pre-visit review of records, ordering of medication, post visit documentation, and face-to-face time with the patient discussing anxiety, depression and alcohol abuse. Discussed continuing current treatment regimen - care team/medication in place.  Discussed potential benefits, risk, and side effects of benzodiazepines to include potential risk of tolerance and dependence, as well as possible drowsiness. Advised patient not to drive if experiencing drowsiness and to take lowest possible effective  dose to minimize risk of dependence and tolerance.  There are no diagnoses linked to this encounter.   Please see After Visit Summary for patient specific instructions.  No future appointments.  No orders of the defined types were placed in this encounter.     -------------------------------      [1]  Allergies Allergen Reactions   Oxycodone  Itching    Pt states that she still takes this medication even with itching  (percocet)

## 2024-07-16 ENCOUNTER — Encounter: Payer: Self-pay | Admitting: Adult Health

## 2024-07-17 NOTE — Addendum Note (Signed)
 Addended by: LAWERANCE ANGELINE SAILOR on: 07/17/2024 07:34 AM   Modules accepted: Level of Service

## 2024-08-12 ENCOUNTER — Other Ambulatory Visit: Payer: Self-pay | Admitting: Adult Health

## 2024-08-12 DIAGNOSIS — F411 Generalized anxiety disorder: Secondary | ICD-10-CM

## 2024-08-12 DIAGNOSIS — F3341 Major depressive disorder, recurrent, in partial remission: Secondary | ICD-10-CM

## 2024-08-26 ENCOUNTER — Telehealth: Admitting: Adult Health

## 2024-08-26 ENCOUNTER — Encounter: Payer: Self-pay | Admitting: Adult Health

## 2024-08-26 DIAGNOSIS — F3341 Major depressive disorder, recurrent, in partial remission: Secondary | ICD-10-CM

## 2024-08-26 DIAGNOSIS — F1010 Alcohol abuse, uncomplicated: Secondary | ICD-10-CM

## 2024-08-26 DIAGNOSIS — F411 Generalized anxiety disorder: Secondary | ICD-10-CM | POA: Diagnosis not present

## 2024-08-26 MED ORDER — NALTREXONE HCL 50 MG PO TABS: 50.0000 mg | ORAL_TABLET | Freq: Every day | ORAL | 2 refills | Status: AC

## 2024-08-26 MED ORDER — SERTRALINE HCL 50 MG PO TABS: 50.0000 mg | ORAL_TABLET | Freq: Every day | ORAL | 2 refills | Status: AC

## 2024-08-26 MED ORDER — ALPRAZOLAM 1 MG PO TABS: 1.0000 mg | ORAL_TABLET | Freq: Three times a day (TID) | ORAL | 2 refills | Status: AC | PRN

## 2024-08-26 NOTE — Progress Notes (Signed)
 Sandy [name] [id number] 10-09-1975 49 y.o.  Virtual Visit via Video Note  I connected with pt @ on 08/26/24 at  1:00 PM EST by a video enabled telemedicine application and verified that I am speaking with the correct person using two identifiers.   I discussed the limitations of evaluation and management by telemedicine and the availability of in person appointments. The patient expressed understanding and agreed to proceed.  I discussed the assessment and treatment plan with the patient. The patient was provided an opportunity to ask questions and all were answered. The patient agreed with the plan and demonstrated an understanding of the instructions.   The patient was advised to call back or seek an in-person evaluation if the symptoms worsen or if the condition fails to improve as anticipated.  I provided 30 minutes of non-face-to-face time during this encounter.  The patient was located at home.  The provider was located at [facility].   Sandy LOISE Sayers, NP   Subjective:   Patient ID:  Sandy [name] is a 49 y.o. (DOB Mar 07, 1976) female.  Chief Complaint: No chief complaint on file.   HPI Sandy [name] presents for follow-up of anxiety, depression and alcohol abuse.   Describes mood today as not where it needs to be. Reports tearfulness. Mood symptoms - reports she still struggles with anxiety, depression and irritability. Reports she continues to deal with situational stressors. Reports lower interest and motivation - I'm having to push myself to get motivated. Reports she is trying to complete self care - more consistently - I'm doing a little bit better. Reports she continues to struggle with day to day tasks. Reports obsessive thoughts. Reports worry, rumination and over thinking. Reports mood remains lower. Reports she continues to drink alcohol, but is weaning off safely - working with [facility]. She continues to take the Xanax  as needed  for anxiety. She is also taking the Zoloft  50mg  daily. Stating I am feeling better overall - I'm optimistic. She plans to return to work on 09/04/2023.  Energy levels is better, but still lower. Active, does not have a regular exercise routine - working on it.  Reports she is unable to enjoy usual interests and activities. Single. Living alone. Parents local. Extended family local.  Appetite is adequate. Reports weight loss. Reports sleeping difficulties - broken. Averages 5 hours a night. Reports some daytime napping 2 to 3 times a week.  Reports improved focus and concentration - a little bit better, not 100%, not where I want to be. She reports completing some household tasks - assisting with parents needs. She is out of work currently and would like to try and return to work on 09/04/2023. She is concerned about the amount of stress she continues to deal with, but is hoping to transition into the work setting without difficulties. Denies SI. Denies HI.  Denies AH or VH. Denies self harm. Denies substance use. Reports alcohol use - working on cessation plan.  Review of Systems:  Review of Systems  Musculoskeletal:  Negative for gait problem.  Neurological:  Negative for tremors.  Psychiatric/Behavioral:         Please refer to HPI    Medications: I have reviewed the patient's current medications.  Current Outpatient Medications  Medication Sig Dispense Refill   albuterol (VENTOLIN HFA) 108 (90 Base) MCG/ACT inhaler Inhale 2 puffs into the lungs every 6 (six) hours as needed for wheezing.     ALPRAZolam  (XANAX ) 1 MG tablet Take 1 tablet (1 mg  total) by mouth 3 (three) times daily as needed for anxiety. 90 tablet 2   cyanocobalamin (VITAMIN B12) 1000 MCG/ML injection Inject 1,000 mcg into the muscle every 30 (thirty) days.     ibuprofen  (ADVIL ) 800 MG tablet Take 1 tablet (800 mg total) by mouth every 6 (six) hours as needed. 30 tablet 3   naltrexone  (DEPADE) 50 MG tablet Take 1  tablet (50 mg total) by mouth daily. 30 tablet 2   sertraline  (ZOLOFT ) 50 MG tablet Take 1 tablet (50 mg total) by mouth daily. 90 tablet 2   No current facility-administered medications for this visit.    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)   Hyperlipidemia    Leiomyoma of body of uterus    intramural & subserous   MDD (major depressive disorder)    Neuromuscular disorder (HCC)    Unknown per pt   Pre-diabetes    Vitamin D  deficiency     Family History  Problem Relation Age of Onset   Healthy Mother    Healthy Father     Social History   Socioeconomic History   Marital status: Single    Spouse name: Not on file   Number of children: Not on file   Years of education: Not on file   Highest education level: Not on file  Occupational History   Occupation: Financial Risk Analyst  Tobacco Use   Smoking status: Former    Types: Cigarettes   Smokeless tobacco: Never   Tobacco comments:    12-17-2023  pt stated quit smoking 2007,  started age 33 (1996)  smoked for 11 yrs  Vaping Use   Vaping status: Never Used  Substance and Sexual Activity   Alcohol use: Not Currently    Comment: 12-17-2023  pt stated quit alcohol 02/ 2025   (hx alcohol abuse disorder)   Drug use: Never   Sexual activity: Yes    Birth control/protection: Condom  Other Topics Concern   Not on file  Social History Narrative   Not on file   Social Drivers of Health   Tobacco Use: Medium Risk (08/26/2024)   Patient History    Smoking Tobacco Use: Former    Smokeless Tobacco Use: Never    Passive Exposure: Not on file  Financial Resource Strain: Low Risk  (07/06/2024)   Received from [facility]   Overall  Financial Resource Strain (CARDIA)    Difficulty of Paying Living Expenses: Not hard at all  Food Insecurity: No Food Insecurity (07/06/2024)   Received from [facility]   Epic    Within the past 12 months, you worried that your food would run out before you got the money to buy more.: Never true    Within the past 12 months, the food you bought just didn't last and you didn't have money to get more.: Never true  Transportation Needs: No Transportation Needs (07/06/2024)   Received from [facility] Transportation    In the past 12 months, has lack of transportation kept you from medical appointments or from getting medications?: No    Lack of Transportation (Non-Medical): No  Physical Activity: Not on file  Stress: Not on file  Social Connections: Not on file  Intimate Partner Violence: Not on file  Depression (PHQ2-9): Medium Risk (03/22/2023)   Depression (PHQ2-9)    PHQ-2 Score: 5  Alcohol Screen: Not on file  Housing: Low Risk  (07/06/2024)   Received from [facility]   Epic    In the last 12 months, was there a time when you were not able to pay the mortgage or rent on time?: No    In the past 12 months, how many times have you moved where you were living?: 0    At any time in the past 12 months, were you homeless or living in a shelter (including now)?: No  Utilities: Not At Risk (07/06/2024)   Received from [facility]   Epic    In the past 12 months has the electric, gas, oil, or water  company threatened to shut off services in your home?: No  Health Literacy: Not on file    Past Medical History, Surgical history, Social history, and Family history were reviewed and updated as appropriate.   Please see review of systems for further details on the patient's review from today.   Objective:   Physical Exam:  There were no vitals taken for this visit.  Physical Exam Constitutional:       General: She is not in acute distress. Musculoskeletal:        General: No deformity.  Neurological:     Mental Status: She is alert and oriented to person, place, and time.     Coordination: Coordination normal.  Psychiatric:        Attention and Perception: Attention and perception normal. She does not perceive auditory or visual hallucinations.        Mood and Affect: Mood is anxious and depressed. Affect is not labile, blunt, angry or inappropriate.        Speech: Speech normal.        Behavior: Behavior normal.        Thought Content: Thought content normal. Thought content is not paranoid or delusional. Thought content does not include homicidal or suicidal ideation. Thought content does not include homicidal or suicidal plan.        Cognition and Memory: Cognition and memory normal.        Judgment: Judgment normal.     Comments: Insight intact     Lab Review:     Component Value Date/Time   NA 138 09/09/2019 0936   K 4.0 09/09/2019 0936   CL 100 09/09/2019 0936   CO2 21 09/09/2019 0936   GLUCOSE 87 09/09/2019 0936   GLUCOSE 95 02/04/2014 1306   BUN 6 09/09/2019 0936   CREATININE 0.72 09/09/2019 0936   CREATININE 0.65 02/04/2014 1306   CALCIUM 10.0 09/09/2019 0936   PROT 7.7 09/09/2019 0936   ALBUMIN  4.5 09/09/2019 0936   AST 31 09/09/2019 0936   ALT 32 09/09/2019 0936   ALKPHOS 60 09/09/2019 0936   BILITOT 0.4 09/09/2019 0936   GFRNONAA 104 09/09/2019 0936   GFRAA 120 09/09/2019 0936       Component Value Date/Time   WBC 8.6 12/19/2023 0958   RBC 5.33 (H) 12/19/2023 0958   HGB 12.6 12/19/2023 0958   HGB 12.7 09/09/2019 0936   HCT 42.2 12/19/2023 0958   HCT 40.3 09/09/2019 0936   PLT 323 12/19/2023 0958   PLT 350 09/09/2019 0936   MCV 79.2 (L) 12/19/2023 0958   MCV 76 (  L) 09/09/2019 0936   MCH 23.6 (L) 12/19/2023 0958   MCHC 29.9 (L) 12/19/2023 0958   RDW 14.5 12/19/2023 0958   RDW 14.8 09/09/2019 0936   LYMPHSABS 2.6 09/09/2019 0936   MONOABS 0.4  12/13/2013 0640   EOSABS 0.1 09/09/2019 0936   BASOSABS 0.0 09/09/2019 0936    No results found for: POCLITH, LITHIUM   No results found for: PHENYTOIN, PHENOBARB, VALPROATE, CBMZ   .res Assessment: Plan:    Plan:  PDMP reviewed  Zoloft  50mg  daily Xanax  0.5mg  daily as needed - taking once a week  Naltrexone  50mg  daily for alcohol cessation through [facility].  Ms Mcmannis feels she has improved and is ready to return work. She may return to work without restrictions effective 09/04/2023.  RTC 4 weeks  Patient advised to contact office with any questions, adverse effects, or acute worsening in signs and symptoms.  30 minutes spent dedicated to the care of this patient on the date of this encounter to include pre-visit review of records, ordering of medication, post visit documentation, and face-to-face time with the patient discussing anxiety, depression and alcohol abuse. Discussed continuing current treatment regimen - care team/medication in place.  Discussed potential benefits, risk, and side effects of benzodiazepines to include potential risk of tolerance and dependence, as well as possible drowsiness. Advised patient not to drive if experiencing drowsiness and to take lowest possible effective dose to minimize risk of dependence and tolerance.  Diagnoses and all orders for this visit:  Major depressive disorder, recurrent episode, in partial remission -     sertraline  (ZOLOFT ) 50 MG tablet; Take 1 tablet (50 mg total) by mouth daily.  Generalized anxiety disorder -     ALPRAZolam  (XANAX ) 1 MG tablet; Take 1 tablet (1 mg total) by mouth 3 (three) times daily as needed for anxiety. -     sertraline  (ZOLOFT ) 50 MG tablet; Take 1 tablet (50 mg total) by mouth daily.  Alcohol abuse -     naltrexone  (DEPADE) 50 MG tablet; Take 1 tablet (50 mg total) by mouth daily.     Please see After Visit Summary for patient specific instructions.  No future  appointments.   No orders of the defined types were placed in this encounter.     -------------------------------      [1]  Allergies Allergen Reactions   Oxycodone  Itching    Pt states that she still takes this medication even with itching  (percocet)

## 2024-08-28 ENCOUNTER — Telehealth: Admitting: Adult Health

## 2024-09-24 ENCOUNTER — Telehealth: Admitting: Adult Health
